# Patient Record
Sex: Female | Born: 1970
Health system: Southern US, Community
[De-identification: ages and names within clinical notes are randomized; demographics above are authoritative.]

## PROBLEM LIST (undated history)

## (undated) DIAGNOSIS — I10 Essential (primary) hypertension: Secondary | ICD-10-CM

## (undated) DIAGNOSIS — K219 Gastro-esophageal reflux disease without esophagitis: Secondary | ICD-10-CM

## (undated) HISTORY — PX: TONSILLECTOMY: SUR1361

## (undated) HISTORY — PX: HAND SURGERY: SHX662

## (undated) HISTORY — PX: ABDOMINAL HYSTERECTOMY: SHX81

## (undated) HISTORY — PX: CHOLECYSTECTOMY: SHX55

---

## 2004-05-13 ENCOUNTER — Ambulatory Visit: Payer: Self-pay | Admitting: Obstetrics and Gynecology

## 2004-06-17 ENCOUNTER — Inpatient Hospital Stay: Payer: Self-pay | Admitting: Obstetrics and Gynecology

## 2005-02-07 ENCOUNTER — Emergency Department: Payer: Self-pay | Admitting: Emergency Medicine

## 2005-11-29 ENCOUNTER — Emergency Department: Payer: Self-pay | Admitting: Emergency Medicine

## 2006-01-12 ENCOUNTER — Other Ambulatory Visit: Payer: Self-pay

## 2006-01-12 ENCOUNTER — Emergency Department: Payer: Self-pay | Admitting: Emergency Medicine

## 2006-08-16 ENCOUNTER — Emergency Department: Payer: Self-pay | Admitting: Emergency Medicine

## 2007-02-16 ENCOUNTER — Ambulatory Visit: Payer: Self-pay | Admitting: Internal Medicine

## 2007-04-26 ENCOUNTER — Ambulatory Visit: Payer: Self-pay | Admitting: Internal Medicine

## 2008-02-14 ENCOUNTER — Emergency Department: Payer: Self-pay | Admitting: Emergency Medicine

## 2008-11-13 ENCOUNTER — Ambulatory Visit: Payer: Self-pay | Admitting: Internal Medicine

## 2008-11-16 ENCOUNTER — Ambulatory Visit: Payer: Self-pay | Admitting: Internal Medicine

## 2008-11-20 ENCOUNTER — Ambulatory Visit: Payer: Self-pay | Admitting: Family Medicine

## 2008-12-11 ENCOUNTER — Emergency Department: Payer: Self-pay | Admitting: Internal Medicine

## 2009-02-10 ENCOUNTER — Other Ambulatory Visit: Payer: Self-pay | Admitting: Obstetrics and Gynecology

## 2009-05-11 ENCOUNTER — Emergency Department: Payer: Self-pay | Admitting: Emergency Medicine

## 2010-09-06 ENCOUNTER — Inpatient Hospital Stay: Payer: Self-pay | Admitting: Specialist

## 2011-02-16 ENCOUNTER — Ambulatory Visit: Payer: Self-pay | Admitting: Sports Medicine

## 2011-02-24 ENCOUNTER — Ambulatory Visit: Payer: Self-pay

## 2011-02-24 ENCOUNTER — Emergency Department: Payer: Self-pay | Admitting: Internal Medicine

## 2011-05-24 ENCOUNTER — Ambulatory Visit: Payer: Self-pay

## 2011-06-01 ENCOUNTER — Ambulatory Visit: Payer: Self-pay | Admitting: Internal Medicine

## 2011-06-14 ENCOUNTER — Ambulatory Visit: Payer: Self-pay

## 2011-06-21 ENCOUNTER — Ambulatory Visit: Payer: Self-pay | Admitting: Internal Medicine

## 2011-08-23 ENCOUNTER — Ambulatory Visit: Payer: Self-pay | Admitting: Podiatry

## 2012-02-21 ENCOUNTER — Inpatient Hospital Stay: Payer: Self-pay | Admitting: Psychiatry

## 2012-02-21 LAB — DRUG SCREEN, URINE
Amphetamines, Ur Screen: NEGATIVE (ref ?–1000)
Benzodiazepine, Ur Scrn: NEGATIVE (ref ?–200)
Cannabinoid 50 Ng, Ur ~~LOC~~: NEGATIVE (ref ?–50)
MDMA (Ecstasy)Ur Screen: NEGATIVE (ref ?–500)
Methadone, Ur Screen: NEGATIVE (ref ?–300)

## 2012-02-21 LAB — URINALYSIS, COMPLETE
Bacteria: NONE SEEN
Bilirubin,UR: NEGATIVE
Glucose,UR: NEGATIVE mg/dL (ref 0–75)
Ketone: NEGATIVE
Nitrite: NEGATIVE
RBC,UR: 1 /HPF (ref 0–5)
Squamous Epithelial: 7
WBC UR: 5 /HPF (ref 0–5)

## 2012-02-21 LAB — COMPREHENSIVE METABOLIC PANEL
Albumin: 4.4 g/dL (ref 3.4–5.0)
Alkaline Phosphatase: 94 U/L (ref 50–136)
Calcium, Total: 9.2 mg/dL (ref 8.5–10.1)
Co2: 23 mmol/L (ref 21–32)
Creatinine: 0.74 mg/dL (ref 0.60–1.30)
EGFR (Non-African Amer.): >60
Glucose: 106 mg/dL — ABNORMAL HIGH (ref 65–99)
SGOT(AST): 197 U/L — ABNORMAL HIGH (ref 15–37)
SGPT (ALT): 102 U/L — ABNORMAL HIGH (ref 12–78)

## 2012-02-21 LAB — CBC
HCT: 43.4 % (ref 35.0–47.0)
HGB: 14.8 g/dL (ref 12.0–16.0)
MCHC: 34.1 g/dL (ref 32.0–36.0)
Platelet: 265 10*3/uL (ref 150–440)
RBC: 4.71 10*6/uL (ref 3.80–5.20)

## 2012-02-21 LAB — TSH: Thyroid Stimulating Horm: 1.53 u[IU]/mL

## 2012-02-21 LAB — ETHANOL
Ethanol %: 0.099 % — ABNORMAL HIGH (ref 0.000–0.080)
Ethanol: 99 mg/dL

## 2012-02-21 LAB — ACETAMINOPHEN LEVEL: Acetaminophen: <2 ug/mL

## 2012-09-26 ENCOUNTER — Emergency Department: Payer: Self-pay | Admitting: Emergency Medicine

## 2012-09-26 LAB — BASIC METABOLIC PANEL
Anion Gap: 6 — ABNORMAL LOW (ref 7–16)
BUN: 21 mg/dL — ABNORMAL HIGH (ref 7–18)
Calcium, Total: 8.5 mg/dL (ref 8.5–10.1)
Chloride: 109 mmol/L — ABNORMAL HIGH (ref 98–107)
Co2: 26 mmol/L (ref 21–32)
Creatinine: 0.96 mg/dL (ref 0.60–1.30)
EGFR (African American): >60
Glucose: 82 mg/dL (ref 65–99)
Sodium: 141 mmol/L (ref 136–145)

## 2012-09-26 LAB — CBC
HGB: 12.9 g/dL (ref 12.0–16.0)
MCH: 31.9 pg (ref 26.0–34.0)
MCHC: 34.8 g/dL (ref 32.0–36.0)
MCV: 92 fL (ref 80–100)
Platelet: 285 10*3/uL (ref 150–440)
RBC: 4.04 10*6/uL (ref 3.80–5.20)

## 2012-10-04 LAB — CBC
HCT: 43.1 % (ref 35.0–47.0)
HGB: 14.6 g/dL (ref 12.0–16.0)
MCV: 92 fL (ref 80–100)
RDW: 12.4 % (ref 11.5–14.5)
WBC: 10.5 10*3/uL (ref 3.6–11.0)

## 2012-10-04 LAB — COMPREHENSIVE METABOLIC PANEL
Alkaline Phosphatase: 93 U/L (ref 50–136)
Anion Gap: 7 (ref 7–16)
BUN: 15 mg/dL (ref 7–18)
Bilirubin,Total: 0.5 mg/dL (ref 0.2–1.0)
Calcium, Total: 8.9 mg/dL (ref 8.5–10.1)
Co2: 27 mmol/L (ref 21–32)
EGFR (Non-African Amer.): >60
Glucose: 94 mg/dL (ref 65–99)
SGOT(AST): 45 U/L — ABNORMAL HIGH (ref 15–37)
SGPT (ALT): 85 U/L — ABNORMAL HIGH (ref 12–78)
Total Protein: 8.4 g/dL — ABNORMAL HIGH (ref 6.4–8.2)

## 2012-10-04 LAB — SEDIMENTATION RATE: Erythrocyte Sed Rate: 1 mm/hr (ref 0–20)

## 2012-10-05 LAB — DRUG SCREEN, URINE
Amphetamines, Ur Screen: NEGATIVE (ref ?–1000)
Cannabinoid 50 Ng, Ur ~~LOC~~: NEGATIVE (ref ?–50)
MDMA (Ecstasy)Ur Screen: NEGATIVE (ref ?–500)
Methadone, Ur Screen: NEGATIVE (ref ?–300)
Opiate, Ur Screen: POSITIVE (ref ?–300)
Phencyclidine (PCP) Ur S: NEGATIVE (ref ?–25)
Tricyclic, Ur Screen: NEGATIVE (ref ?–1000)

## 2012-10-06 ENCOUNTER — Inpatient Hospital Stay: Payer: Self-pay | Admitting: Family Medicine

## 2012-10-06 LAB — PROTEIN ELECTROPHORESIS(ARMC)

## 2012-10-07 LAB — PROTIME-INR: Prothrombin Time: 12.3 secs (ref 11.5–14.7)

## 2012-10-11 ENCOUNTER — Ambulatory Visit: Payer: Self-pay | Admitting: Family Medicine

## 2012-10-11 LAB — CSF CELL COUNT WITH DIFFERENTIAL
CSF Tube #: 1
CSF Tube #: 4
Eosinophil: 0 %
Neutrophils: 0 %
Neutrophils: 0 %
Other Cells: 0 %
WBC (CSF): 7 /mm3

## 2012-10-11 LAB — PROTEIN, CSF: Protein, CSF: 44 mg/dL (ref 15–45)

## 2012-10-11 LAB — GLUCOSE, CSF: Glucose, CSF: 60 mg/dL (ref 40–75)

## 2012-10-14 LAB — CSF CULTURE

## 2012-10-15 ENCOUNTER — Ambulatory Visit: Payer: Self-pay | Admitting: Internal Medicine

## 2012-11-04 ENCOUNTER — Emergency Department: Payer: Self-pay | Admitting: Emergency Medicine

## 2012-11-04 LAB — CBC WITH DIFFERENTIAL/PLATELET
Basophil %: 0.5 %
Eosinophil %: 1.5 %
HGB: 13.5 g/dL (ref 12.0–16.0)
Lymphocyte #: 2.7 10*3/uL (ref 1.0–3.6)
MCHC: 34.5 g/dL (ref 32.0–36.0)
Monocyte #: 1.1 x10 3/mm — ABNORMAL HIGH (ref 0.2–0.9)
Monocyte %: 10.7 %
Neutrophil #: 6.6 10*3/uL — ABNORMAL HIGH (ref 1.4–6.5)
Neutrophil %: 62 %
RBC: 4.45 10*6/uL (ref 3.80–5.20)
WBC: 10.6 10*3/uL (ref 3.6–11.0)

## 2012-11-04 LAB — COMPREHENSIVE METABOLIC PANEL
Alkaline Phosphatase: 87 U/L (ref 50–136)
Anion Gap: 8 (ref 7–16)
BUN: 9 mg/dL (ref 7–18)
Bilirubin,Total: 1.4 mg/dL — ABNORMAL HIGH (ref 0.2–1.0)
Calcium, Total: 9.2 mg/dL (ref 8.5–10.1)
Chloride: 108 mmol/L — ABNORMAL HIGH (ref 98–107)
Co2: 24 mmol/L (ref 21–32)
Creatinine: 0.85 mg/dL (ref 0.60–1.30)
Osmolality: 278 (ref 275–301)
Potassium: 3.2 mmol/L — ABNORMAL LOW (ref 3.5–5.1)
SGPT (ALT): 29 U/L (ref 12–78)
Sodium: 140 mmol/L (ref 136–145)

## 2013-05-27 ENCOUNTER — Ambulatory Visit: Payer: Self-pay | Admitting: Family Medicine

## 2013-08-17 IMAGING — CR DG CHEST 2V
1 series · 2 of 2 positions shown · non-contrast
Comparison: none

REASON FOR EXAM: cough x 10 days.
COMMENTS:   May transport without cardiac monitor

PROCEDURE:     DXR - DXR CHEST PA (OR AP) AND LATERAL  - November 04, 2012 [DATE]
RESULT:     Comparison: 06/24/2011

[Series 1: w chest pa · 0.14mm/px · 2 of 2 slices shown]
[im 1/2]
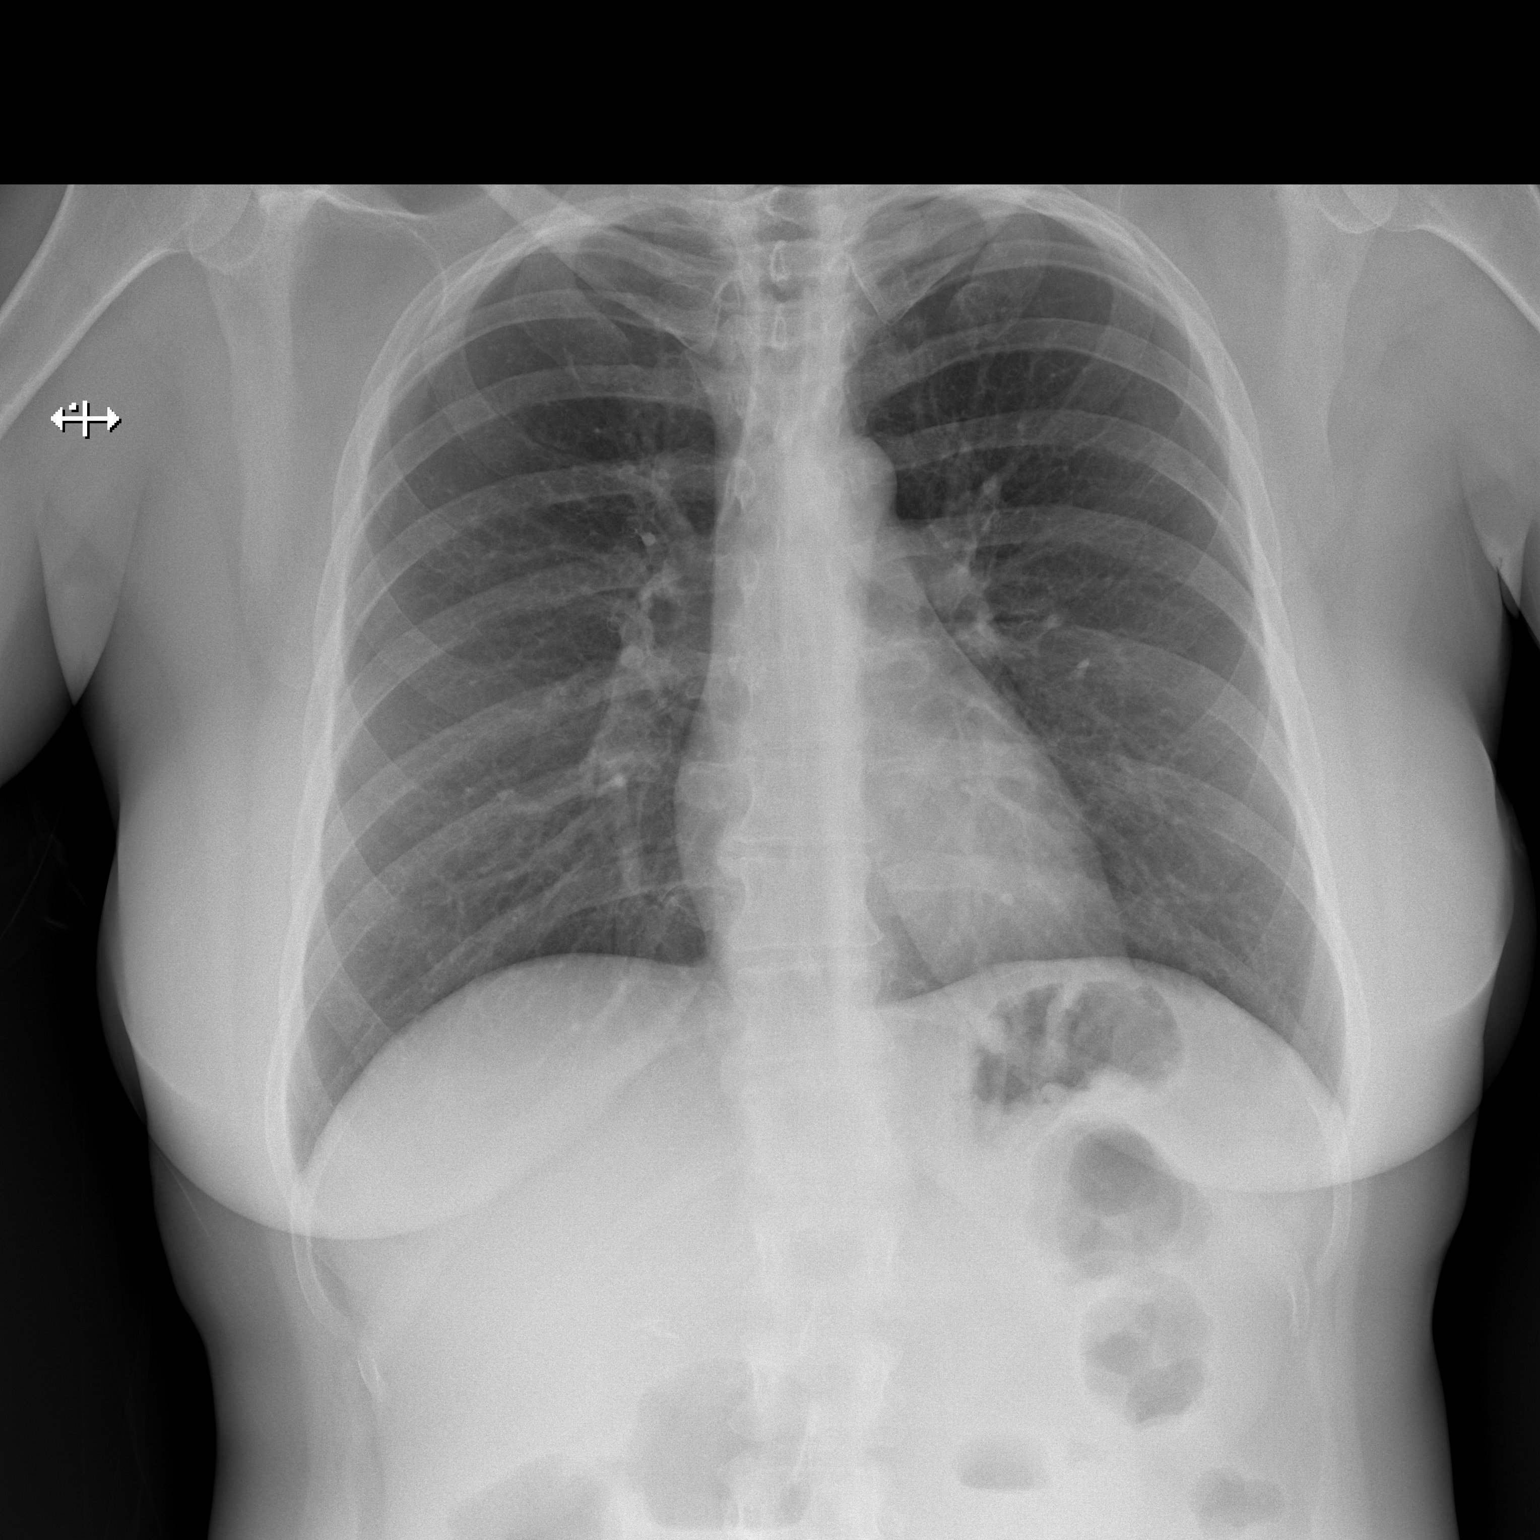
[im 2/2]
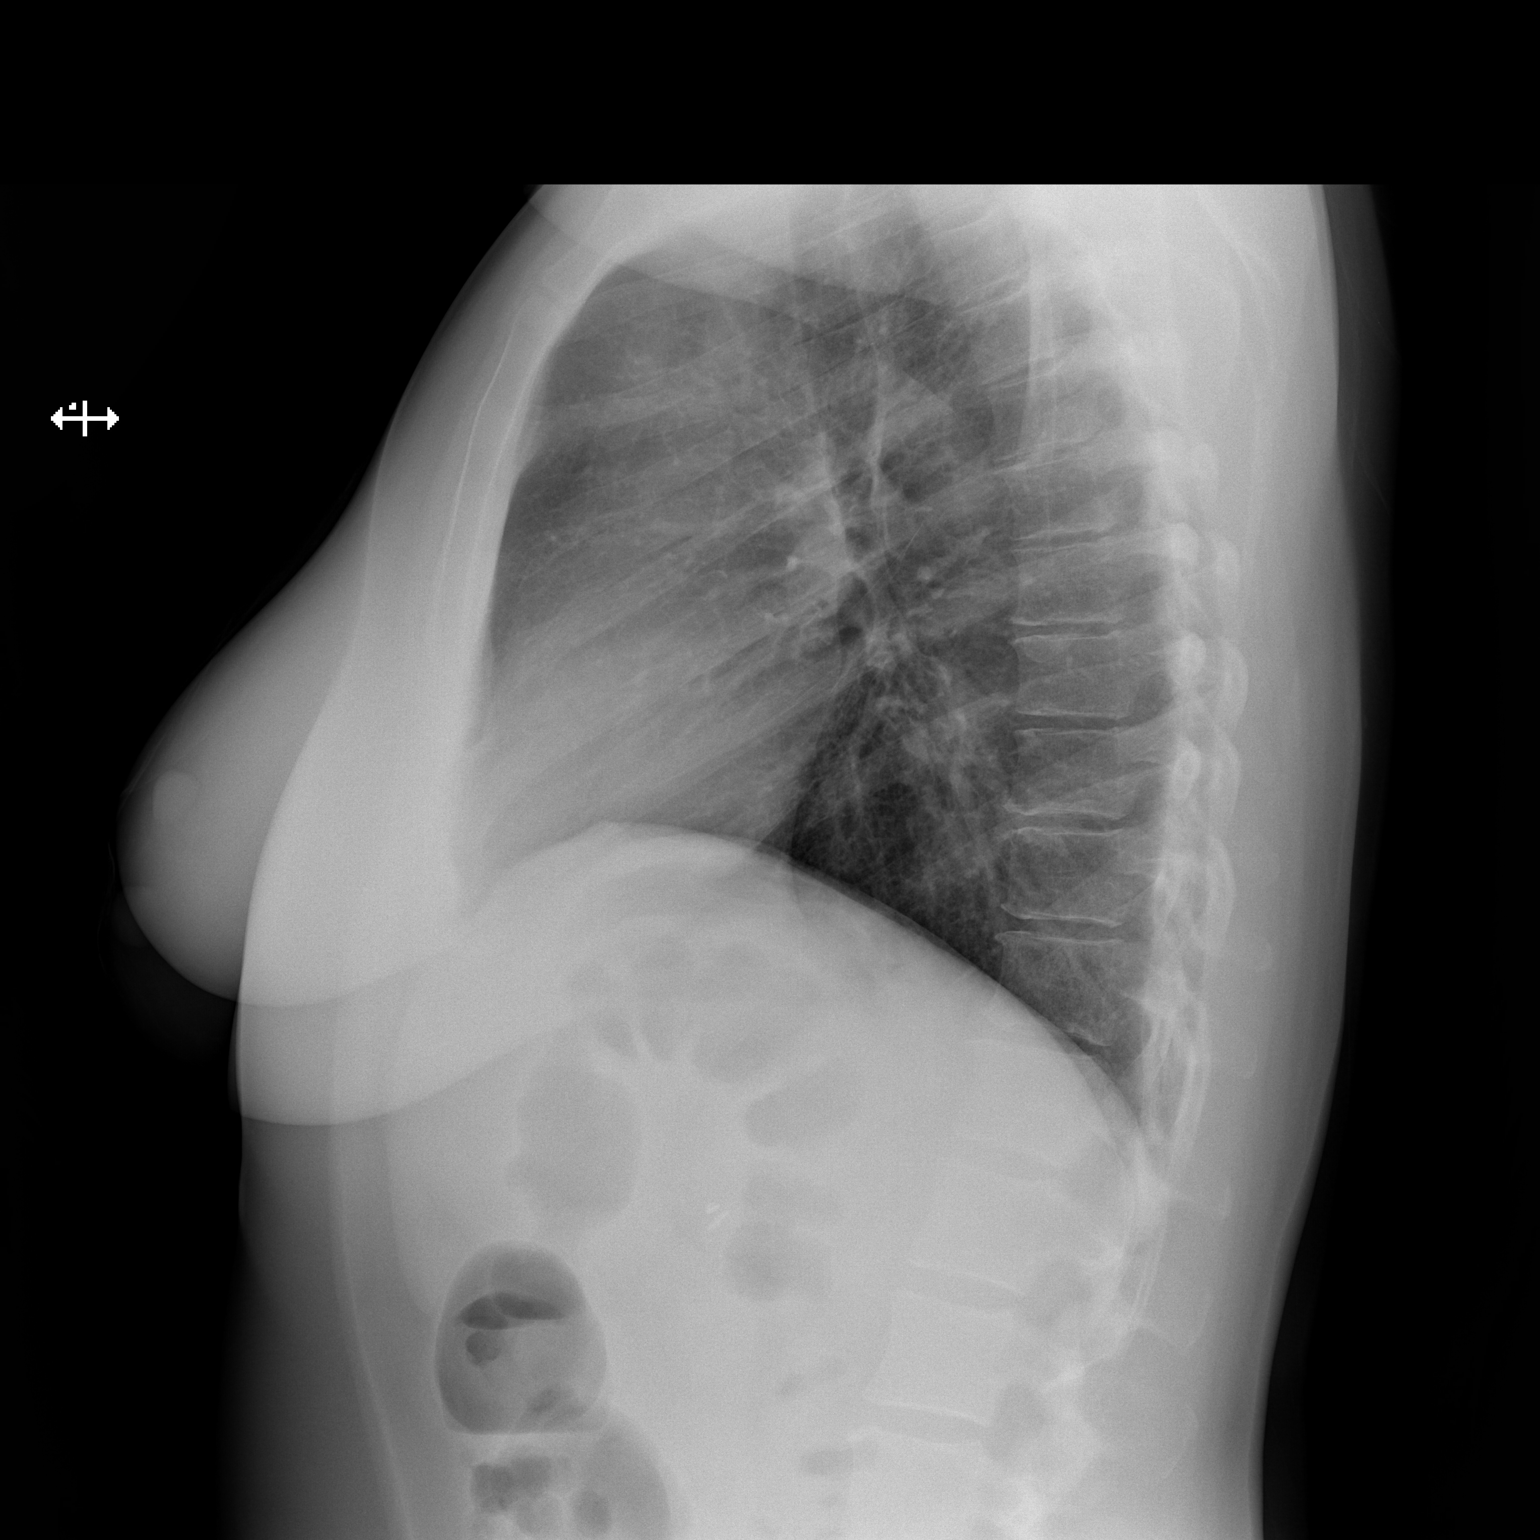

[2 of 2 positions shown; findings below may reference images not displayed]

FINDINGS: The heart and mediastinum are stable. No focal pulmonary opacities.
IMPRESSION: No acute cardiopulmonary disease.

[REDACTED]

## 2013-12-18 ENCOUNTER — Ambulatory Visit: Payer: Self-pay | Admitting: Family Medicine

## 2013-12-26 ENCOUNTER — Ambulatory Visit: Payer: Self-pay | Admitting: Internal Medicine

## 2014-02-17 ENCOUNTER — Ambulatory Visit: Payer: Self-pay | Admitting: Family Medicine

## 2014-02-17 ENCOUNTER — Ambulatory Visit: Payer: Self-pay | Admitting: Emergency Medicine

## 2014-02-17 LAB — CBC WITH DIFFERENTIAL/PLATELET
Basophil #: 0 10*3/uL (ref 0.0–0.1)
Basophil %: 0.4 %
Eosinophil #: 0.2 10*3/uL (ref 0.0–0.7)
Eosinophil %: 2.3 %
HCT: 39.6 % (ref 35.0–47.0)
HGB: 13 g/dL (ref 12.0–16.0)
Lymphocyte #: 3.6 10*3/uL (ref 1.0–3.6)
Lymphocyte %: 34.2 %
MCH: 30.5 pg (ref 26.0–34.0)
MCHC: 32.8 g/dL (ref 32.0–36.0)
MCV: 93 fL (ref 80–100)
Monocyte #: 0.6 x10 3/mm (ref 0.2–0.9)
Monocyte %: 5.5 %
Neutrophil #: 6 10*3/uL (ref 1.4–6.5)
Neutrophil %: 57.6 %
Platelet: 285 10*3/uL (ref 150–440)
RBC: 4.26 10*6/uL (ref 3.80–5.20)
RDW: 12 % (ref 11.5–14.5)
WBC: 10.4 10*3/uL (ref 3.6–11.0)

## 2014-02-17 LAB — COMPREHENSIVE METABOLIC PANEL
Albumin: 4.2 g/dL (ref 3.4–5.0)
Alkaline Phosphatase: 71 U/L
Anion Gap: 11 (ref 7–16)
BUN: 11 mg/dL (ref 7–18)
Bilirubin,Total: 1.2 mg/dL — ABNORMAL HIGH (ref 0.2–1.0)
Calcium, Total: 9 mg/dL (ref 8.5–10.1)
Chloride: 100 mmol/L (ref 98–107)
Co2: 25 mmol/L (ref 21–32)
Creatinine: 0.99 mg/dL (ref 0.60–1.30)
EGFR (African American): >60
EGFR (Non-African Amer.): >60
Glucose: 86 mg/dL (ref 65–99)
Osmolality: 271 (ref 275–301)
Potassium: 3.3 mmol/L — ABNORMAL LOW (ref 3.5–5.1)
SGOT(AST): 17 U/L (ref 15–37)
SGPT (ALT): 27 U/L
Sodium: 136 mmol/L (ref 136–145)
Total Protein: 7.7 g/dL (ref 6.4–8.2)

## 2014-02-17 LAB — URINALYSIS, COMPLETE
Blood: NEGATIVE
Glucose,UR: NEGATIVE
Ketone: NEGATIVE
Leukocyte Esterase: NEGATIVE
Nitrite: NEGATIVE
Ph: 6 (ref 5.0–8.0)
Specific Gravity: >=1.03 (ref 1.000–1.030)

## 2014-02-17 LAB — LIPASE, BLOOD: Lipase: 149 U/L (ref 73–393)

## 2014-02-17 LAB — AMYLASE: Amylase: 55 U/L (ref 25–115)

## 2014-02-18 ENCOUNTER — Ambulatory Visit: Payer: Self-pay | Admitting: Emergency Medicine

## 2014-02-19 LAB — URINE CULTURE

## 2014-03-04 ENCOUNTER — Ambulatory Visit: Payer: Self-pay | Admitting: Physician Assistant

## 2014-03-09 ENCOUNTER — Ambulatory Visit: Payer: Self-pay | Admitting: Physician Assistant

## 2014-04-17 ENCOUNTER — Emergency Department: Payer: Self-pay | Admitting: Emergency Medicine

## 2014-04-26 ENCOUNTER — Ambulatory Visit: Payer: Self-pay | Admitting: Family Medicine

## 2014-07-20 ENCOUNTER — Emergency Department: Payer: Self-pay | Admitting: Student

## 2014-07-26 ENCOUNTER — Ambulatory Visit: Payer: Self-pay | Admitting: Surgery

## 2014-07-31 DIAGNOSIS — S63055A Dislocation of other carpometacarpal joint of left hand, initial encounter: Secondary | ICD-10-CM | POA: Insufficient documentation

## 2014-08-24 DIAGNOSIS — T8460XA Infection and inflammatory reaction due to internal fixation device of unspecified site, initial encounter: Secondary | ICD-10-CM | POA: Insufficient documentation

## 2014-10-11 ENCOUNTER — Emergency Department: Admit: 2014-10-11 | Disposition: A | Discharge status: Home / Self Care | Payer: Self-pay | Admitting: Internal Medicine

## 2014-10-11 LAB — TROPONIN I: Troponin-I: <0.03 ng/mL

## 2014-10-11 LAB — COMPREHENSIVE METABOLIC PANEL
ANION GAP: 11 (ref 7–16)
Albumin: 4.6 g/dL
Alkaline Phosphatase: 101 U/L
BILIRUBIN TOTAL: 1.1 mg/dL
BUN: 15 mg/dL
CO2: 28 mmol/L
Calcium, Total: 9.4 mg/dL
Chloride: 98 mmol/L — ABNORMAL LOW
Creatinine: 0.72 mg/dL
EGFR (Non-African Amer.): >60
Glucose: 92 mg/dL
Potassium: 3.2 mmol/L — ABNORMAL LOW
SGOT(AST): 58 U/L — ABNORMAL HIGH
SGPT (ALT): 92 U/L — ABNORMAL HIGH
Sodium: 137 mmol/L
TOTAL PROTEIN: 7.9 g/dL

## 2014-10-11 LAB — URINALYSIS, COMPLETE
BLOOD: NEGATIVE
Bilirubin,UR: NEGATIVE
Glucose,UR: NEGATIVE mg/dL (ref 0–75)
Ketone: NEGATIVE
Leukocyte Esterase: NEGATIVE
Nitrite: NEGATIVE
PH: 6 (ref 4.5–8.0)
Protein: NEGATIVE
RBC,UR: <1 /HPF (ref 0–5)
Specific Gravity: 1.011 (ref 1.003–1.030)
Squamous Epithelial: 28
WBC UR: 1 /HPF (ref 0–5)

## 2014-10-11 LAB — CBC
HCT: 40.9 % (ref 35.0–47.0)
HGB: 13.7 g/dL (ref 12.0–16.0)
MCH: 29.8 pg (ref 26.0–34.0)
MCHC: 33.6 g/dL (ref 32.0–36.0)
MCV: 89 fL (ref 80–100)
Platelet: 303 10*3/uL (ref 150–440)
RBC: 4.62 10*6/uL (ref 3.80–5.20)
RDW: 12.4 % (ref 11.5–14.5)
WBC: 10.3 10*3/uL (ref 3.6–11.0)

## 2014-10-11 LAB — TSH: THYROID STIMULATING HORM: 1.136 u[IU]/mL

## 2014-10-24 NOTE — Discharge Summary (Signed)
PATIENT NAME:  Cynthia Kramer, Cynthia Kramer MR#:  [id number] DATE OF BIRTH:  1971/02/20  DATE OF ADMISSION:  02/21/2012 DATE OF DISCHARGE:  02/24/2012  HOSPITAL COURSE: See dictated History and Physical for details of admission. This 44 year old woman was brought to the Emergency Room after making some calls to her family suggesting possible suicidality and being picked up by the Police. Initially she was sad, tearful, and resistant to coming into the hospital. She has calmed down and for the last couple of days has been calm and cooperative. Her affect is upbeat. She is participating in groups and shows improved insight. She has been counseled about the importance of staying off of alcohol and intoxicating drugs because of their effect on behavior and mood. She has not wanted to start any psychiatric medicine, and there is not a clear indication for insisting on starting antidepressants. She has been educated that she needs to get involved in counseling to help with stress management. At this point, she denied suicidal ideation and is not showing any psychotic or suicidal behavior. She has been treated with medicine for her blood pressure. She has p.r.n. trazodone if needed to assist with sleep. The patient will be discharged with [facility].   LABORATORY, DIAGNOSTIC AND RADIOLOGICAL DATA: Admission labs showed 1+ blood in the urine, possible white blood cells. Drug screen positive for opiates. TSH normal. CBC normal. Alcohol level was 99.0. ALT 102, AST 197, total protein 8.4.   DISCHARGE MEDICATIONS:  1. Hydrochlorothiazide 25 mg per day.  2. Trazodone 100 mg at night p.r.n. for sleep.  3. Amoxicillin 875 mg twice a day for her tooth infection.  4. Zyrtec 10 mg per day.   MENTAL STATUS EXAM AT DISCHARGE: Neatly dressed and groomed woman. Cooperative. Good eye contact. Normal psychomotor activity. Affect smiling, upbeat and reactive. Mood stated as good. Thoughts appear lucid with no evidence  of loosening of associations or delusions. Denies delusions or hallucinations. Denies suicidal or homicidal ideation. Shows reasonably good judgment and insight. Intelligence normal, short and long-term memory intact.   DISPOSITION: Discharge home. [facility].  DIAGNOSIS, PRINCIPAL AND PRIMARY:  AXIS I: Depression, not otherwise specified.   SECONDARY DIAGNOSES:  AXIS I:  1. Alcohol abuse.  2. Opiate abuse.   AXIS II: Deferred.   AXIS III:  1. High blood pressure. 2. Tooth infection.   AXIS IV: Moderate-to-severe from recent stress from a friend dying and stress in her home life.   AXIS V: Functioning at time of discharge 60.   ____________________________ Audery AmelJohn T. Clapacs, MD jtc:cbb D: 02/24/2012 12:41:18 ET T: 02/24/2012 17:09:41 ET JOB#: [id number]  cc: Audery AmelJohn T. Clapacs, MD, <Dictator> Audery AmelJOHN T CLAPACS MD ELECTRONICALLY SIGNED 02/24/2012 21:33

## 2014-10-24 NOTE — Consult Note (Signed)
Brief Consult Note: Comments: Psychiatry: Patient needs admission for stabilization and prob. detox. H&P will be done. Orders done.  Electronic Signatures: Audery Amellapacs, Nafeesa Dils T (MD)  (Signed 17-Aug-13 15:06)  Authored: Brief Consult Note   Last Updated: 17-Aug-13 15:06 by Audery Amellapacs, Latonyia Lopata T (MD)

## 2014-10-24 NOTE — H&P (Signed)
PATIENT NAME:  Cynthia Kramer, Cynthia Kramer MR#:  [id number] DATE OF BIRTH:  1971-03-17  DATE OF ADMISSION:  02/21/2012  IDENTIFYING INFORMATION: The patient is a 44 year old woman admitted under involuntary petition to the Emergency Room.   CHIEF COMPLAINT: "I just want to go home."   HISTORY OF PRESENT ILLNESS: Information was obtained from the patient and from the commitment petition and the chart. The patient was brought in under commitment petition filed by the police, who had been called by her family out of concern for her. When they found her she a loaded hand gun on her and was wandering out in the woods in the dark. The patient states that she has been under a lot of stress. Major stresses are that an elderly gentleman she had developed a close relationship with passed away a couple of days ago. Also, she has been fighting with her boyfriend which she blames on his substance abuse problem. Also, she has been having financial strain because she is out of work since losing her job a couple of months ago. She has been feeling more nervous and down, feeling kind of overwhelmed. Apparently she made a phone call to her daughter in which she made some statements that were interpreted as possibly indicating suicidal ideation. The patient now denies that that was what she was intending to say. She also apparently left a note for her boyfriend that could possibly be interpreted as suicidal, although again she is denying that that is what she meant by it. She admits that she had been drinking alcohol at the time that this happened, had had several glasses of wine over the course of the day, although she minimizes the degree to which that was a contributing problem. She also has been probably drinking a little bit more recently most days for the last couple of weeks, probably 1 to 2 drinks most days, although again she minimizes the degree to which it is a problem. She is not currently getting any psychiatric treatment and  says she is not on any psychiatric medication.   PAST PSYCHIATRIC HISTORY: She has never been in a psychiatric hospital. She says that she has never seen a therapist, counselor or psychiatrist, although she does report that about 10 years ago around the time of her divorce she was prescribed Prozac for a while. She said that it made her feel lazy and so she stopped taking it. She denies ever having been diagnosed with any mental health problems. No history of suicide attempts. She denies any history of violence.   SUBSTANCE ABUSE HISTORY: The patient denies that she has ever had any kind of substance abuse problem. She denies that she uses any drugs. She does say that she has been drinking a little bit more recently but  plays down the effects.   PAST MEDICAL HISTORY: The patient has high blood pressure for which she takes hydrochlorothiazide. She also has some gastric reflux symptoms for which she takes Nexium. She denies any other ongoing medical problems.   SOCIAL HISTORY: She is divorced. Recently she has been living with her boyfriend. She says that he has a substance abuse problem. She has three children, a 45 year old son who lives with his father, and 45 year old daughters, two of them, one of whom called the police. The patient used to work at [employer] but lost her job a few months ago after missing work on a couple of occasions. She does not have any insurance currently now. She feels under a  lot of financial stress.   FAMILY HISTORY: No known family history of mental illness.   CURRENT MEDICATIONS:  1. Hydrochlorothiazide 25 mg per day.  2. Nexium 40 mg per day.  3. Zyrtec 10 mg per day.   ALLERGIES: No known drug allergies.   REVIEW OF SYSTEMS: She is feeling tearful, sad, says that she is feeling embarrassed. She denies any acute pain. She denies any nausea. She denies any suicidal or homicidal ideation. She denies hallucinations.   MENTAL STATUS EXAM: The patient is a somewhat  disheveled woman, looks her stated age. Cooperative with the interview. Eye contact is poor. Psychomotor activity decreased. She stays curled up a bit, rocks some especially when she gets upset. Speech is normal in rate, tone, and volume. Affect is tearful distressed. Mood is stated as being better. Thoughts are generally lucid. No evidence of psychotic thinking or loosening of associations. She denies auditory or visual hallucinations. She denies any suicidal or homicidal ideation. Intelligence appears to be average. Long-term memory is intact. Memory for the events that happened last night is a little bit sketchy. Current short-term memory is grossly intact. Recent judgment is impaired. Current judgment is still impaired, insight impaired.    PHYSICAL EXAMINATION:  GENERAL: The patient looks upset but does not appear to be in any clear acute physical distress.   VITAL SIGNS: She has a most recent pulse of 98, respirations 18, blood pressure 145/71, temperature 97.5.   HEENT: Pupils are equal and reactive. Face is symmetric. Head is symmetric. Oral mucosa is normal.   NECK AND BACK: Nontender.   SKIN: No acute skin lesions identified.   NEUROLOGICAL: Cranial nerves are all intact and symmetric, strength and reflexes normal throughout.   LUNGS: Clear to auscultation.   HEART: Regular rate and rhythm.   ABDOMEN: Soft, nontender, normal bowel sounds. Normal gait.   LABORATORY, DIAGNOSTIC AND RADIOLOGICAL DATA: Urinalysis: Possible positive for urinary tract infection. Drug screen positive for opiates. TSH normal. CBC normal. Alcohol level on admission was 99.0. Chemistry panel showed a glucose just slightly elevated of no significance, but her ALT is elevated at 102, AST elevated at 197, total protein elevated at 8.4.   ASSESSMENT: This is a 44 year old woman brought in under involuntary commitment having made statements that were interpreted as being potentially suicidal. She is still upset,  tearful, a little bit agitated, not psychotic. She had opiates and alcohol in her system when she came in. She has been under a lot of stress recently. She is denying that she was suicidal or has any suicidal ideation, but the whole story is worrisome enough that I think it is best to admit her to the hospital.   TREATMENT PLAN: Admit the patient to Psychiatry. Medications for anxiety and sleep p.r.n.  will be provided. She will be oriented to the unit and included in groups and activities. We will try and get collateral history, if possible. We will work with her on stress management. Continue to evaluate whether she needs any psychiatric medicine.  DIAGNOSIS, PRINCIPAL AND PRIMARY:  AXIS I: Depression, not otherwise specified.   SECONDARY DIAGNOSES:  AXIS I:  1. Rule out alcohol abuse. 2. Rule out opiate abuse.   AXIS II: Deferred.   AXIS III:  1. High blood pressure. 2. Gastric reflux symptoms. 3. Seasonal allergies.   AXIS IV: Severe stress by her report from recent death of a friend and fighting with her boyfriend.   AXIS V: Functioning at time of evaluation 30.  ____________________________ Audery Amel, MD jtc:cbb D: 02/21/2012 16:44:05 ET T: 02/22/2012 08:22:13 ET JOB#: [id number]  cc: Audery Amel, MD, <Dictator> Audery Amel MD ELECTRONICALLY SIGNED 02/22/2012 16:48

## 2014-10-27 NOTE — Discharge Summary (Signed)
PATIENT NAME:  Cynthia Kramer, BAGENT MR#:  [id number] DATE OF BIRTH:  19-Dec-1970  DATE OF ADMISSION:  10/06/2012 DATE OF DISCHARGE:  10/07/2012  REASON FOR ADMISSION: Multiple complaints including headache, numbness and tingling of upper and lower extremities, blurry vision.   DISCHARGE DIAGNOSES:   1.  Headache. 2.  Numbness and tingling of upper and lower extremities 3.  Blurry vision.  Unknown reason for numbness and tingling of upper or lower extremities, double vision and headache with normal MRI.  The patient is undergoing work-up for multiple sclerosis as a possible diagnosis, but not confirmed.  PRIMARY CARE PHYSICIAN: At [facility].   FOLLOW UP: Follow up with Dr. Yehuda Savannah Burn and Dr. Gurney Maxin in the next 7 to 14 days.   PROCEDURES PLANNED:  The patient needs a LP, which has been arranged to be done this following Tuesday in the radiology department and it is going to be for ruling out the possibility of multiple sclerosis.   MEDICATIONS AT DISCHARGE: Hydrochlorothiazide 25 mg daily, Zyrtec 10 mg daily, Flonase 50 mcg once a day, Nexium 30 mg once a day, Norco one every 4 hours p.r.n. pain and headache, tramadol 50 mg every 4 hours needed for pain, alprazolam 0.5 mg every 6 hours.   IMPORTANT RESULTS:  Ultrasound of carotid arteries showed no significant or hemodynamically significant carotid stenosis. There is less than 50% stenosis on the right side and the left side good, both with good anterograde flow. MRI of the brain unremarkable, pre and postcontrast brain MRI. There was no MR evidence of demyelinating or dysmyelinating disorder. Vascular flow was unremarkable.  The globes were  homogeneous without signal or enhancement abnormalities. Evaluation of the extra ocular muscles demonstrated no signal or enhancement abnormalities and the muscles appear symmetric.  The visualized portions of the optic nerve and chiasm show no signal or enhancement abnormalities.  Cerebellum,  pons and midbrain were overall normal. Cervical spine x-rays show some degenerative disk changes. Given the patient's symptoms, a follow up MRI is recommended although by Dr. Hardie Shackleton evaluation, the patient has good reflexes up and down for which this was not absolutely necessary.  Lower spine no bony abnormalities. There is disk space narrowing at the level of L5-S1.  The first segment appears to be transitional for what if the problem persists, maybe the next follow up would be MR of the neck and lumbar spine. Again by examination of Dr. Gurney Maxin at this moment was not a test that needed to be completed as she did not have any significant findings that correlate with her symptoms. She does, but not findings.  No acute intracranial process on the CT scan.   LABORATORY DATA:  Glucose was normal at 94, creatinine 0.71, sodium was slightly low at 135 (the patient takes hydrochlorothiazide), potassium was 3.6. Overall electrolytes were under normal limits. Her AST and ALT were slightly elevated at 45 and 85. Her UDS showed positive opiates, negative for the rest. White count 10.5, erythrocyte sedimentation rate was 1, platelet count 246, hemoglobin 14.6. PT-INR were normal with a PT and INR of 0.9. Hepatitis profile was negative.  HIV antibodies were negative, nonreactive. Serum protein electrophoresis was overall unremarkable. There was some more tests, ANA that were ordered. They have not been reported and they need to be followed by primary care physician.   The other thing is the results were from the LP need to be sent to Dr. Gurney Maxin or [facility].  HOSPITAL COURSE:  This patient is a very nice 44 year old female who came to the ER with  definite multiple neurologic complaints to include upper extremity numbness and tingling, headache and blurry vision.  Also she said that she was dizzy and lightheaded and her lower extremities were tingling well.  Apparently few days prior  to this the patient developed abrupt onset of vision loss in the right eye and that happened at night.  The vision loss came back few hours, but since then continued to be blurry. Currently she has some blurry vision on the other side on the right eye. She had some mild double vision that resolved and numbness and tingling in a patchy distribution of the upper extremities and on her face bilaterally it appeared after that. The patient was very anxious.  As mentioned above, MRI did not show any signs of this and the physical exam pretty much unremarkable.   As far as her visual disturbance, optic neuritis was a possibility, but very unlikely based on the MRI findings. There were no sclerotic white matter lesions of the brain for which multiple sclerosis is less likely, although Dr. Melrose Nakayama recommended to continue work-up with completion including a spinal tap checking for oligoclonal bands and IgG in this.    Carotid ultrasound was done.  The patient was recommended to take a baby aspirin for her headaches.  There was no risk for tumor cerebral, no temporal arteritis and her ESR was negative. Her numbness was very unclear due to the distribution of it, but there were no signs of significant neuropathy. Overall, the patient did well, she started feeling better. We were not able to do the LP in-house based on the patient is taking naproxen prior to the LP.   CONDITION ON DISCHARGE:  The patient discharged in good condition with good recommendations to follow up with her primary care physician.  Discharge time 40 min      ____________________________ [place] Sink, MD rsg:ct D: 10/08/2012 07:12:00 ET T: 10/08/2012 07:37:03 ET JOB#: [id number]  cc: Welby Sink, MD, <Dictator> Doneta Public. Melrose Nakayama, MD [place] MD ELECTRONICALLY SIGNED 10/14/2012 22:35

## 2014-10-27 NOTE — Consult Note (Signed)
Referring Physician:  Henreitta Leber   Primary Care Physician:  BURNS, HARRIET [phone]  Reason for Consult: Admit Date: 04-Oct-2012  Chief Complaint: HA, blurry visio.  Reason for Consult: blurry vision   History of Present Illness: History of Present Illness:   HISTORY OF PRESENT ILLNESS:  woman who presented on March 31st, 2014 to the ED with various neurologic complaints including upper extremity numbness and tingling, headaches and blurry vision.  patient says today that a few days ago she developed abrupt onset vision loss in the right eye.  This happened at night.  The vision loss came back somewhat over the next few hours, but has never gotten back to normal.  Currently she complains of some blurry vision in the right eye.  In addition, she says that there is also worse blurriness when looking to the right with either eye.  There is also some mild double vision.  In addition, she has developed a numbness and tingling in a patchy distribution in her upper extremities and in her face bilaterally.  She feels very anxious.  She is concerned that she has multiple sclerosis.  She had an MRI planned for next week but this was moved up since she came to the ER.  No clear cause for her symptoms.  Nothing makes them better or worse.  She feels that the symptoms are severe.   MEDICAL HISTORY:  Depression, hypertension, GERD.  No known surgeries. HISTORY:  Quit smoking cigs a few years ago.  No alcohol abuse. No drug use.   HISTORY:  No significant family history of multiple sclerosis or other autoimmune disease.   MEDICATIONS:  Nexium 40 mg daily, cetirizine 10 mg daily, Norco 5/325 mg 1 to 2 tabs q. 6 hours as needed, hydrochlorothiazide 25 mg daily and Flonase 1 spray to each nostril daily.   ALLERGIES:  No known drug allergies.   EXAM   Anxious.  NAD.  Normocephalic and atraumatic. exam shows normal disc size, appearance and C/D ratio without clear evidence of papilledema. and S2  sounds are within normal limits, without murmurs, gallops, or rubs. - Normal- NormalDrift - Absent bilaterally.- Gait and station are within normal limits.    Shoulder abduction (deltoid/supraspinatus, axillary/suprascapular n, C5)   Elbow flexion (biceps brachii, musculoskeletal n, C5-6)   Elbow extension (triceps, radial n, C7)   Finger adduction (interossei, ulnar n, T1)    Hip flexion (iliopsoas, L1/L2)   Knee flexion (hamstrings, sciatic n, L5/S1)   Knee extension (quadriceps, femoral n, L3/4)   Ankle dorsiflexion (tibialis anterior, deep fibular n, L4/5)   Ankle plantarflexion (gastroc, tibial n, S1) STATUS:is oriented to person, place and time.  Recent and remote memory are intact.  Attention span and concentration are intact.  Naming, repetition, comprehension and expressive speech are within normal limits.  Patient's fund of knowledge is within normal limits for educational level. NERVES:double vision with lateral gaze but sclera are buried bilaterally. -reports blurry vision in the right eye only, visual fields are normal and good acuity. Normal    CN II (normal visual acuity and visual fields)   CN III, IV, VI (extraocular muscles are intact)   CN V (facial sensation is intact bilaterally)   CN VII (facial strength is intact bilaterally)   CN VIII (hearing is intact bilaterally)   CN IX/X (palate elevates midline, normal phonation)   CN XI (shoulder shrug strength is normal and symmetric)   CN XII (tongue protrudes midline)  She endorses  decreased sensation to all modalities throughout her face and in a patchy distribution in both upper extremities.  3+/3+    Biceps 3+/3+    Brachioradialis  3+/3+    Patellar 3+/3+    Achilles Hoffman sign is absent.  Babinski sign is absent.  to nose testing is within normal limits.   MR BRAIN WO/W CONTRAST  - Oct 05 2012  9:24AM viewed and discussed with patient and family, unremarkable.Technique: Multiplanar/multisequence imaging of the brain is pre  and post intravenous administration of 15 ml of IV  Evaluation of the diffusion weighted imaging demonstrates no abnormalities. There is not evidence of abnormal parenchymal or enhancing masses or nodules. No intra-axial or extra-axial collections are identified. The sella and parasellar regions and as well as the cerebellopontine angle regions and visualized  of the seventh and eighth cranial nerves demonstrates no signal enhancement abnormalities. There is no MR evidence of demyelinating or disorders. The vascular flow-voids are unremarkable. The pons and midbrain demonstrate no signal or enhancement The ventricles and cisterns are patent. There is no of paranasal sinus disease within the visualized paranasal Limited evaluation of the orbits demonstrates no gross or extraconal abnormalities or pre or post septal The globes are homogeneous without signal or enhancement Limited evaluation of the extraocular muscles demonstrates signal or enhancement abnormalities. The muscles appear symmetric. The portions of the optic nerve and chiasm demonstrate no signal enhancement abnormalities.  Unremarkable pre an post contrasted brain MRI  AND PLAN:  44 year old woman who presented on March 31st, 2014 to the ED with various neurologic complaints including upper extremity numbness and tingling, headaches and blurry vision. DISTURBANCEsclerosis with optic neuritis is the clear concern.  There is pain with moving the right eye in setting of blurry vision in the right eye.  However, brain MRI is completely normal w and wo contrast.  This makes optic neuritis much less likely.  In addition, there are no sclerotic white matter brain lesions at all which makes multiple sclerosis much less likely.  On exam she has increased reflexes but the reflexes are equal bilaterally and there are no pathologic Hoffman or Babinski responses.  To be certain, would recommend completion of M.S. workup with SPINAL TAP to check for IgG index and  oligoclonal bands.  In setting of normal MRI, this is very unlikely to be positive but not unprecedented.  The finding that is most concerning is persistent pain with moving the right eye which is very specific for optic neuritis.   that the visual disturbance came about rapidly like a shade coming down over the eye, this is concerning for a vascular event.  Would recommend CAROTID ULTRASOUND to evaluate for carotid aftery stenosis.  Patient should take BABY ASPIRIN once a day.  cause of the headaches is unclear, no tumor, demyelination or stroke identified.  Possible tension headache.  Does not have risk factors for pseudotumor.  Denies jaw claudication to suggest temporal arteritis.  MRA BRAIN WITHOUT CONTRAST could be considered to rule out aneuysm if initial workup is negative, no clear pupillary asymmetry to suggest compression of the oculomotor nerve.   cause.  No clear distribution.  Inlcudes bilateral face and upper extremities which is unusual as it includes mutliple tracts in different locations, not explained by one clear lesion.    Melrose Nakayama, MD  ROS:  General denies complaints   HEENT blurry vision   Lungs no complaints   Cardiac no complaints   GI no complaints   GU no complaints  Musculoskeletal no complaints   Extremities no complaints   Skin no complaints   Neuro headache  numbness/tingling   Endocrine no complaints   Psych anxiety   Past Medical/Surgical Hx:  Hx of Hyperthyroidism:   Anxiety:   Hypertension:   Ovarian Cyst:   Endometriosis:   Hysterectomy - Partial \{Endometriosis\}:   Cholecystectomy:   Tonsillectomy:   Home Medications: Medication Instructions Last Modified Date/Time  Norco 5 mg-325 mg oral tablet 1-2 tab(s) PO every 4 hours as needed FOR PAIN  31-Mar-14 18:34  hydrochlorothiazide 25 mg oral tablet 1 tab(s) orally once a day 31-Mar-14 18:34  Zyrtec 10 mg oral tablet 1 tab(s) orally once a day 31-Mar-14 18:34  Flonase 50 mcg/inh nasal  spray 1 spray(s) nasal once a day 31-Mar-14 18:34  Nexium 30 milligram(s) orally once a day 31-Mar-14 18:34   [facility] Current Meds:  Acetaminophen * tablet, ( Tylenol (325 mg) tablet)  650 mg Oral q4h PRN for pain or temp. greater than 100.4  - Indication: Pain/Fever  Ondansetron injection, ( Zofran injection )  4 mg, IV push, q4h PRN for Nausea/Vomiting  Indication: Nausea/ Vomiting  Cetirizine HCl tablet, ( ZyrTEC)  10 mg Oral daily  - Indication: Rhinitis/ Allergy Symptoms  Esomeprazole capsule, ( NexIUM capsule)  40 mg Oral ac/break  - Indication: Erosive esophagitis, GERD, Peptic Ulcer Disease and Prevention  Instructions:  May open capsule and mix in 1 tablespoon of applesauce for immediate administration.  Fluticasone Propionate Nasal Spray, ( Flonase Nasal spray )  2 spray(s) Both Nostrils daily  -Indication:Seasonal Allergy, Rhinitis, Asthma  Shake Well Before Use  Hydrochlorothiazide tablet, ( Esidrix)  25 mg Oral daily  - Indication: Edema/ Hypertension/ Diuresis/ CHF  Acetaminophen-HYDROcodone 325/5 mg tablet,  ( HYDROcodone-Acetaminophen  5/325 mg tablet)  1 to 2 tablet(s) Oral q4h PRN for pain  - Indication: Pain  Instructions:  [Med Admin Window: 30 mins before or after scheduled dose]  Allergies:  No Known Allergies:   Vital Signs: **Vital Signs.:   01-Apr-14 06:20  Vital Signs Type Routine  Temperature Temperature (F) 97.9  Celsius 36.6  Temperature Source oral  Pulse Pulse 79  Respirations Respirations 19  Systolic BP Systolic BP 357  Diastolic BP (mmHg) Diastolic BP (mmHg) 83  Mean BP 95  Pulse Ox % Pulse Ox % 99  Pulse Ox Activity Level  At rest  Oxygen Delivery Room Air/ 21 %    09:28  Vital Signs Type Routine  Temperature Temperature (F) 97.7  Celsius 36.5  Temperature Source oral  Pulse Pulse 79  Respirations Respirations 18  Systolic BP Systolic BP 017  Diastolic BP (mmHg) Diastolic BP (mmHg) 86  Mean BP 99  Pulse Ox % Pulse Ox % 100   Pulse Ox Activity Level  At rest  Oxygen Delivery Room Air/ 21 %    14:25  Vital Signs Type Routine  Temperature Temperature (F) 97.9  Celsius 36.6  Temperature Source oral  Pulse Pulse 85  Respirations Respirations 18  Systolic BP Systolic BP 793  Diastolic BP (mmHg) Diastolic BP (mmHg) 80  Mean BP 93  Pulse Ox % Pulse Ox % 98  Pulse Ox Activity Level  At rest  Oxygen Delivery Room Air/ 21 %    21:35  Vital Signs Type Routine  Temperature Temperature (F) 98.6  Celsius 37  Temperature Source oral  Pulse Pulse 89  Respirations Respirations 18  Systolic BP Systolic BP 903  Diastolic BP (mmHg) Diastolic BP (mmHg) 90  Mean BP 101  Pulse Ox % Pulse Ox % 99  Pulse Ox Activity Level  At rest  Oxygen Delivery Room Air/ 21 %   Lab Results:  Hepatic:  31-Mar-14 17:06   Bilirubin, Total 0.5  Alkaline Phosphatase 93  SGPT (ALT)  85  SGOT (AST)  45  Total Protein, Serum  8.4  Albumin, Serum 4.3  Routine Chem:  31-Mar-14 17:06   Glucose, Serum 94  BUN 15  Creatinine (comp) 0.71  Sodium, Serum  135  Potassium, Serum 3.6  Chloride, Serum 101  CO2, Serum 27  Calcium (Total), Serum 8.9  Osmolality (calc) 271  eGFR (African American) >60  eGFR (Non-African American) >60 (eGFR values <47m/min/1.73 m2 may be an indication of chronic kidney disease (CKD). Calculated eGFR is useful in patients with stable renal function. The eGFR calculation will not be reliable in acutely ill patients when serum creatinine is changing rapidly. It is not useful in  patients on dialysis. The eGFR calculation may not be applicable to patients at the low and high extremes of body sizes, pregnant women, and vegetarians.)  Anion Gap 7  Urine Drugs:  [id number]  Tricyclic Antidepressant, Ur Qual (comp) NEGATIVE (Result(s) reported on 05 Oct 2012 at 08:43PM.)  Amphetamines, Urine Qual. NEGATIVE  MDMA, Urine Qual. NEGATIVE  Cocaine Metabolite, Urine Qual. NEGATIVE  Opiate, Urine qual  POSITIVE  Phencyclidine, Urine Qual. NEGATIVE  Cannabinoid, Urine Qual. NEGATIVE  Barbiturates, Urine Qual. NEGATIVE  Benzodiazepine, Urine Qual. NEGATIVE (----------------- The URINE DRUG SCREEN provides only a preliminary, unconfirmed analytical test result and should not be used for non-medical  purposes.  Clinical consideration and professional judgment should be  applied to any positive drug screen result due to possible interfering substances.  A more specific alternate chemical method must be used in order to obtain a confirmed analytical result.  Gas chromatography/mass spectrometry (GC/MS) is the preferred confirmatory method.)  Methadone, Urine Qual. NEGATIVE  Routine Hem:  31-Mar-14 17:06   Erythrocyte Sed Rate 1 (Result(s) reported on 04 Oct 2012 at 08:30PM.)  WBC (CBC) 10.5  RBC (CBC) 4.69  Hemoglobin (CBC) 14.6  Hematocrit (CBC) 43.1  Platelet Count (CBC) 323 (Result(s) reported on 04 Oct 2012 at 06:01PM.)  MCV 92  MCH 31.1  MCHC 33.8  RDW 12.4   Radiology Results: CT:    31-Mar-14 17:14, CT Head Without Contrast  CT Head Without Contrast   REASON FOR EXAM:    c/o head pain, blurred vision, bilaterial numbness.  COMMENTS:       PROCEDURE: CT  - CT HEAD WITHOUT CONTRAST  - Oct 04 2012  5:14PM     RESULT: Comparison:  None    Technique: Multiple axial images from the foramen magnum to thevertex   were obtained without IV contrast.    Findings:      There is no evidence of mass effect, midline shift, or extra-axial fluid   collections.  There is no evidence of a space-occupying lesion or   intracranial hemorrhage. There is no evidenceof a cortical-based area of     acute infarction.      The ventricles and sulci are appropriate for the patient's age. The basal   cisterns are patent.    Visualized portions of the orbits are unremarkable. The visualized   portions of the paranasal sinuses and mastoid air cells are unremarkable.     The osseous  structures are unremarkable.    IMPRESSION:      No acute intracranial  process.    Dictation Site: 1        Verified By: Jennette Banker, M.D., MD   Electronic Signatures: Anabel Bene (MD)  (Signed 02-Apr-14 02:11)  Authored: REFERRING PHYSICIAN, Primary Care Physician, Consult, History of Present Illness, Review of Systems, PAST MEDICAL/SURGICAL HISTORY, HOME MEDICATIONS, Current Medications, ALLERGIES, NURSING VITAL SIGNS, LAB RESULTS, RADIOLOGY RESULTS   Last Updated: 02-Apr-14 02:11 by Anabel Bene (MD)

## 2014-10-27 NOTE — H&P (Signed)
PATIENT NAME:  Cynthia Kramer, Cynthia Kramer MR#:  [id number] DATE OF BIRTH:  [birth year]  DATE OF ADMISSION:  10/04/2012  PRIMARY CARE PHYSICIAN:  [facility].   CHIEF COMPLAINT:  Headache, numbness and tingling and blurry vision.   HISTORY OF PRESENT ILLNESS:  This is a 44 year old female who presents to the hospital complaining of upper extremity numbness and tingling, a headache and also some blurry vision. The patient says her symptoms began a few days back when she just developed some blurry vision and all of a sudden she lost vision in her right eye. It transiently came back a few hours later. She went to see her primary care physician at the [facility] who referred her to get an MRI as an outpatient and also told her that if her symptoms got any worse to come to the ER for further evaluation. Over the weekend, the patient has developed some worsening upper extremity numbness and tingling and also continues to have some transient blurry vision and at some point while driving, she had some loss of vision again. She therefore came to the ER for further evaluation. Presently, she continues to complain of numbness and tingling and a mild headache, but her vision has improved since then. Hospitalist service was contacted for treatment and evaluation.   REVIEW OF SYSTEMS:   CONSTITUTIONAL: No documented fever. No weight gain or weight loss.  EYES: No double vision.  ENT: No tinnitus. No postnasal drip. No redness of oropharynx.  RESPIRATORY: No cough, no wheeze. No hemoptysis.  CARDIOVASCULAR: No chest pain, no orthopnea, no palpitations, no syncope.  GASTROINTESTINAL: No nausea. No vomiting, no diarrhea. No abdominal pain, no melena, no hematochezia.  GENITOURINARY: No dysuria or hematuria.  ENDOCRINE: No polyuria or nocturia. No heat or cold intolerance.  HEMATOLOGIC: No anemia, no bruising, no bleeding.  INTEGUMENTARY: No rashes. No lesions.  MUSCULOSKELETAL: No arthritis, swelling or gout.   NEUROLOGIC: Positive numbness. Positive tingling. No ataxia. No seizure-type activity.  PSYCHIATRIC: No anxiety, no insomnia, no ADD. Positive depression.   PAST MEDICAL HISTORY:  Consistent with depression, hypertension, GERD.   ALLERGIES:  No known drug allergies.   SOCIAL HISTORY:  Used to be a smoker, quit about a couple of years back. No alcohol abuse. No illicit drug abuse. Lives at home with her fiance and her 2 daughters.   FAMILY HISTORY:  No significant family history of multiple sclerosis. The patient cannot recall her father's history. Her mother is alive, only has high blood pressure.   CURRENT MEDICATIONS:  Nexium 40 mg daily, cetirizine 10 mg daily, Norco 5/325 mg 1 to 2 tabs q. 6 hours as needed, hydrochlorothiazide 25 mg daily and Flonase 1 spray to each nostril daily.   PHYSICAL EXAMINATION:  VITAL SIGNS: On admission is as follows: Temperature 98.2, pulse 92, respirations 18, blood pressure 145/85 and saturation 100% on room air.  GENERAL: She is a pleasant-appearing female who appears a bit anxious, but in no apparent distress.  HEENT: She is atraumatic, normocephalic. Her extraocular muscles are intact. Pupils equal and reactive to light. Sclerae are anicteric. No conjunctival injection. No oropharyngeal erythema.  NECK: Supple. There is no jugular venous distention, no bruits, no lymphadenopathy or thyromegaly.  HEART: Regular rate and rhythm. No murmurs, rubs or clicks.  LUNGS: Clear to auscultation bilaterally. No rales, no rhonchi, no wheezes.  ABDOMEN: Soft, flat, nontender, nondistended. Has good bowel sounds. No hepatosplenomegaly appreciated.  EXTREMITIES: No evidence of any cyanosis, clubbing or peripheral edema.  Has +2 pedal and radial pulses bilaterally.  NEUROLOGIC: The patient is alert, awake and oriented x 3. No focal, motor or sensory deficits appreciated bilaterally.  SKIN: Moist and warm with no rash appreciated.  LYMPHATIC: There is no cervical or  axillary lymphadenopathy.   DIAGNOSTIC DATA:  Showed serum glucose of 94, BUN 15, creatinine 0.7, sodium 135, potassium 3.6, chloride 101 and bicarbonate 27. LFTs are mildly elevated with AST 45 and ALT 85. White cell count is 10.5, hemoglobin 14.6, hematocrit 43, platelet count 323. Sedimentation rate is 1. The patient had a CT of the head done without contrast which showed no evidence of any acute intracranial process,   ASSESSMENT AND PLAN:  This is a 44 year old female with a history of depression, hypertension, gastroesophageal reflux disease who presents to the hospital with blurry vision, headache and bilateral upper extremity numbness and tingling.  1.  Blurry vision, headache and numbness and tingling. The exact etiology of this is unclear, but suspicious for multiple sclerosis as the patient's symptoms have been episodic and transient. She continues to have a persistent headache and some numbness and tingling in upper extremities still. She has had 2 CT heads in the past 2 weeks which have been negative. I will go ahead and get an MRI of her brain with and without contrast and get a neurology consult. The patient's sedimentation rate is normal presently. We will follow q. 4 hour neuro checks.  2.  Hypertension. Continue hydrochlorothiazide.  3.  Gastroesophageal reflux disease. Continue Nexium.  4.  Depression. The patient was admitted to [facility] in August of last year for suicidal ideations. Presently, she denies any suicidal ideations or homicidal ideations, although she was crying at one point during the exam. I would consider a psychiatry consult if her MRI is negative, but that can also be done as an outpatient.   CODE STATUS:  THE PATIENT IS A FULL CODE.   TIME SPENT:  50 minutes.    ____________________________ Rolly PancakeVivek J. Cherlynn KaiserSainani, MD vjs:si D: 10/04/2012 21:04:53 ET T: 10/04/2012 21:40:38 ET JOB#: [id number]  cc: Rolly PancakeVivek J. Cherlynn KaiserSainani, MD, <Dictator> Houston SirenVIVEK J Shaunae Sieloff  MD ELECTRONICALLY SIGNED 10/06/2012 21:18

## 2014-11-14 ENCOUNTER — Emergency Department: Payer: Medicaid Other

## 2014-11-14 ENCOUNTER — Encounter: Payer: Self-pay | Admitting: Medical Oncology

## 2014-11-14 ENCOUNTER — Emergency Department
Admission: EM | Admit: 2014-11-14 | Discharge: 2014-11-14 | Discharge status: Against Medical Advice | Payer: Medicaid Other | Attending: Emergency Medicine | Admitting: Emergency Medicine

## 2014-11-14 DIAGNOSIS — I10 Essential (primary) hypertension: Secondary | ICD-10-CM | POA: Insufficient documentation

## 2014-11-14 DIAGNOSIS — M79605 Pain in left leg: Secondary | ICD-10-CM | POA: Diagnosis not present

## 2014-11-14 DIAGNOSIS — R0602 Shortness of breath: Secondary | ICD-10-CM | POA: Insufficient documentation

## 2014-11-14 DIAGNOSIS — R51 Headache: Secondary | ICD-10-CM | POA: Diagnosis not present

## 2014-11-14 DIAGNOSIS — R519 Headache, unspecified: Secondary | ICD-10-CM

## 2014-11-14 DIAGNOSIS — R2242 Localized swelling, mass and lump, left lower limb: Secondary | ICD-10-CM | POA: Diagnosis not present

## 2014-11-14 HISTORY — DX: Essential (primary) hypertension: I10

## 2014-11-14 LAB — CBC
HEMATOCRIT: 39.5 % (ref 35.0–47.0)
Hemoglobin: 13.3 g/dL (ref 12.0–16.0)
MCH: 31 pg (ref 26.0–34.0)
MCHC: 33.7 g/dL (ref 32.0–36.0)
MCV: 91.9 fL (ref 80.0–100.0)
PLATELETS: 335 10*3/uL (ref 150–440)
RBC: 4.3 MIL/uL (ref 3.80–5.20)
RDW: 13.1 % (ref 11.5–14.5)
WBC: 11.9 10*3/uL — ABNORMAL HIGH (ref 3.6–11.0)

## 2014-11-14 LAB — BASIC METABOLIC PANEL
ANION GAP: 6 (ref 5–15)
BUN: 16 mg/dL (ref 6–20)
CHLORIDE: 107 mmol/L (ref 101–111)
CO2: 27 mmol/L (ref 22–32)
Calcium: 8.5 mg/dL — ABNORMAL LOW (ref 8.9–10.3)
Creatinine, Ser: 0.77 mg/dL (ref 0.44–1.00)
GFR calc Af Amer: >60 mL/min (ref >60)
GFR calc non Af Amer: >60 mL/min (ref >60)
Glucose, Bld: 77 mg/dL (ref 65–99)
Potassium: 3.4 mmol/L — ABNORMAL LOW (ref 3.5–5.1)
Sodium: 140 mmol/L (ref 135–145)

## 2014-11-14 NOTE — ED Notes (Signed)
No answer when called from lobby 

## 2014-11-14 NOTE — ED Notes (Signed)
Pt reports that she has had a "knot" with swelling to left upper thigh for approx 8 days, also reports headache and difficulty breathing.

## 2014-11-29 ENCOUNTER — Emergency Department: Payer: Medicaid Other

## 2014-11-29 ENCOUNTER — Emergency Department
Admission: EM | Admit: 2014-11-29 | Discharge: 2014-11-29 | Disposition: A | Discharge status: Home / Self Care | Payer: Medicaid Other | Attending: Emergency Medicine | Admitting: Emergency Medicine

## 2014-11-29 ENCOUNTER — Encounter: Payer: Self-pay | Admitting: Emergency Medicine

## 2014-11-29 DIAGNOSIS — I159 Secondary hypertension, unspecified: Secondary | ICD-10-CM

## 2014-11-29 DIAGNOSIS — R51 Headache: Secondary | ICD-10-CM | POA: Diagnosis present

## 2014-11-29 MED ORDER — PROCHLORPERAZINE EDISYLATE 5 MG/ML IJ SOLN
INTRAMUSCULAR | Status: AC
  Filled 2014-11-29: qty 2

## 2014-11-29 NOTE — ED Notes (Addendum)
PT REPORTS SHE HAS BEEN HAVING THESE SYMPTOMS FOR ABOUT A MONTH, PT REPORTS SHE WAS SEEN IN ER IN THE PAST AND WAS DIAGNOSED WITH PANIC ATTACK. PT REPORTS SHE FEELS LIKE HER BLOOD PRESSURE IS UP AND SHE DEVELOPED A HEADACHE, PT REFUSED MEDICATION FOR HEADACHE. "I AM NOT HERE FOR THE HEADACHE SOMETHING IS WRONG AND I NEED TO SEE A DR"

## 2014-11-29 NOTE — ED Notes (Signed)
NO E-SIGNATURE

## 2014-11-29 NOTE — ED Notes (Addendum)
PT LEFT EXAM ROOM AMBULATORY NO DISTRESS NOTED.  DR. Derrill KayGOODMAN TALKED TO PT ABOUT DISCHARGE INSTRUCTIONS, PT LEFT EXAM ROOM PRIOR TO RECEIVE PAPER INSTRUCTIONS. PT LEFT.

## 2014-11-29 NOTE — ED Provider Notes (Signed)
[facility] Emergency Department Provider Note    ____________________________________________  Time seen: 2145  I have reviewed the triage vital signs and the nursing notes.   HISTORY  Chief Complaint Headache   History limited by: Not Limited   HPI Cynthia Kramer is a 44 y.o. female who presents to the emergency department today after an episode of dizziness, difficulty speaking, difficulty with vision, difficulty with hearing that occurred today. Patient states that she was walking to her car when the symptoms started rather abruptly. She states the intensive symptoms lasted maybe a couple minutes. She then started gradually getting better. At the time of examination the symptoms have resolved. Patient states she had a similar symptom yesterday and then has had similar symptoms in the past. She thinks it is related to her blood pressure as her blood pressure was really high recently when she has had these symptoms. States she went to primary care doctor who doubled her hydrochlorothiazide. Denies loss of consciousness. Denies recent fevers.     Past Medical History  Diagnosis Date  . Hypertension     There are no active problems to display for this patient.   Past Surgical History  Procedure Laterality Date  . Tonsillectomy    . Cholecystectomy    . Abdominal hysterectomy      No current outpatient prescriptions on file.  Allergies Phenergan and Ultram  No family history on file.  Social History History  Substance Use Topics  . Smoking status: Never Smoker   . Smokeless tobacco: Not on file  . Alcohol Use: Yes     Comment: socially    Review of Systems  Constitutional: Negative for fever. Cardiovascular: Negative for chest pain. Respiratory: Negative for shortness of breath. Gastrointestinal: Negative for abdominal pain, vomiting and diarrhea. Genitourinary: Negative for dysuria. Musculoskeletal: Negative for back pain. Skin:  Negative for rash. Neurological: Positive for headaches   10-point ROS otherwise negative.  ____________________________________________   PHYSICAL EXAM:  VITAL SIGNS: ED Triage Vitals  Enc Vitals Group     BP 11/29/14 1838 144/48 mmHg     Pulse Rate 11/29/14 1838 107     Resp 11/29/14 1838 20     Temp 11/29/14 1838 98.9 F (37.2 C)     Temp Source 11/29/14 1838 Oral     SpO2 11/29/14 1838 100 %     Weight 11/29/14 1838 167 lb (75.751 kg)     Height 11/29/14 1838  (1.651 m)     Head Cir --      Peak Flow --      Pain Score 11/29/14 1839 9   Constitutional: Alert and oriented. Well appearing and in no distress. Eyes: Conjunctivae are normal. PERRL. Normal extraocular movements. ENT   Head: Normocephalic and atraumatic.   Nose: No congestion/rhinnorhea.   Mouth/Throat: Mucous membranes are moist.   Neck: No stridor. Hematological/Lymphatic/Immunilogical: No cervical lymphadenopathy. Cardiovascular: Normal rate, regular rhythm.  No murmurs, rubs, or gallops. Respiratory: Normal respiratory effort without tachypnea nor retractions. Breath sounds are clear and equal bilaterally. No wheezes/rales/rhonchi. Gastrointestinal: Soft and nontender. No distention.  Genitourinary: Deferred Musculoskeletal: Normal range of motion in all extremities. No joint effusions.  No lower extremity tenderness nor edema. Neurologic:  Normal speech and language. No gross focal neurologic deficits are appreciated. Speech is normal.  Skin:  Skin is warm, dry and intact. No rash noted. Psychiatric: Mood and affect are normal. Speech and behavior are normal. Patient exhibits appropriate insight and judgment.  ____________________________________________    LABS (pertinent positives/negatives)  None  ____________________________________________   EKG  None  ____________________________________________    RADIOLOGY  CT head IMPRESSION: Normal head  CT. ____________________________________________   PROCEDURES  Procedure(s) performed: None  Critical Care performed: No  ____________________________________________   INITIAL IMPRESSION / ASSESSMENT AND PLAN / ED COURSE  Pertinent labs & imaging results that were available during my care of the patient were reviewed by me and considered in my medical decision making (see chart for details).  Patient here after episode of headache, dizziness, vision change and hearing change. The symptoms have now resolved. On exam no concerning physical findings. Head CT was negative. Unclear etiology of symptoms however given clinical story I doubt seizure, doubt TIA. Discussed with patient that she will need to follow up with her primary care doctor. Also recommended that she speak to cardiologist and neurologist. Patient verbalized understanding. No discharge.  ____________________________________________   FINAL CLINICAL IMPRESSION(S) / ED DIAGNOSES  Final diagnoses:  Secondary hypertension, unspecified     Phineas SemenGraydon Sieanna Vanstone, MD 11/29/14 2238

## 2014-11-29 NOTE — ED Notes (Signed)
Pt reports headache, difficulty speaking and bilateral ear pain that started at 5pm today with n/v.

## 2014-11-29 NOTE — Discharge Instructions (Signed)
Please seek medical attention for any high fevers, chest pain, shortness of breath, change in behavior, persistent vomiting, bloody stool or any other new or concerning symptoms. ° °Hypertension °Hypertension, commonly called high blood pressure, is when the force of blood pumping through your arteries is too strong. Your arteries are the blood vessels that carry blood from your heart throughout your body. A blood pressure reading consists of a higher number over a lower number, such as 110/72. The higher number (systolic) is the pressure inside your arteries when your heart pumps. The lower number (diastolic) is the pressure inside your arteries when your heart relaxes. Ideally you want your blood pressure below 120/80. °Hypertension forces your heart to work harder to pump blood. Your arteries may become narrow or stiff. Having hypertension puts you at risk for heart disease, stroke, and other problems.  °RISK FACTORS °Some risk factors for high blood pressure are controllable. Others are not.  °Risk factors you cannot control include:  °· Race. You may be at higher risk if you are African American. °· Age. Risk increases with age. °· Gender. Men are at higher risk than women before age 45 years. After age 65, women are at higher risk than men. °Risk factors you can control include: °· Not getting enough exercise or physical activity. °· Being overweight. °· Getting too much fat, sugar, calories, or salt in your diet. °· Drinking too much alcohol. °SIGNS AND SYMPTOMS °Hypertension does not usually cause signs or symptoms. Extremely high blood pressure (hypertensive crisis) may cause headache, anxiety, shortness of breath, and nosebleed. °DIAGNOSIS  °To check if you have hypertension, your health care provider will measure your blood pressure while you are seated, with your arm held at the level of your heart. It should be measured at least twice using the same arm. Certain conditions can cause a difference in  blood pressure between your right and left arms. A blood pressure reading that is higher than normal on one occasion does not mean that you need treatment. If one blood pressure reading is high, ask your health care provider about having it checked again. °TREATMENT  °Treating high blood pressure includes making lifestyle changes and possibly taking medicine. Living a healthy lifestyle can help lower high blood pressure. You may need to change some of your habits. °Lifestyle changes may include: °· Following the DASH diet. This diet is high in fruits, vegetables, and whole grains. It is low in salt, red meat, and added sugars. °· Getting at least 2½ hours of brisk physical activity every week. °· Losing weight if necessary. °· Not smoking. °· Limiting alcoholic beverages. °· Learning ways to reduce stress. ° If lifestyle changes are not enough to get your blood pressure under control, your health care provider may prescribe medicine. You may need to take more than one. Work closely with your health care provider to understand the risks and benefits. °HOME CARE INSTRUCTIONS °· Have your blood pressure rechecked as directed by your health care provider.   °· Take medicines only as directed by your health care provider. Follow the directions carefully. Blood pressure medicines must be taken as prescribed. The medicine does not work as well when you skip doses. Skipping doses also puts you at risk for problems.   °· Do not smoke.   °· Monitor your blood pressure at home as directed by your health care provider.  °SEEK MEDICAL CARE IF:  °· You think you are having a reaction to medicines taken. °· You have recurrent headaches or feel   dizzy. °· You have swelling in your ankles. °· You have trouble with your vision. °SEEK IMMEDIATE MEDICAL CARE IF: °· You develop a severe headache or confusion. °· You have unusual weakness, numbness, or feel faint. °· You have severe chest or abdominal pain. °· You vomit repeatedly. °· You  have trouble breathing. °MAKE SURE YOU:  °· Understand these instructions. °· Will watch your condition. °· Will get help right away if you are not doing well or get worse. °Document Released: 06/23/2005 Document Revised: 11/07/2013 Document Reviewed: 04/15/2013 °[employer]® Patient Information ©2015 [employer]. This information is not intended to replace advice given to you by your health care provider. Make sure you discuss any questions you have with your health care provider. ° °

## 2015-01-13 ENCOUNTER — Encounter: Payer: Self-pay | Admitting: General Practice

## 2015-01-13 ENCOUNTER — Emergency Department: Payer: Medicaid Other

## 2015-01-13 ENCOUNTER — Emergency Department
Admission: EM | Admit: 2015-01-13 | Discharge: 2015-01-13 | Disposition: A | Discharge status: Home / Self Care | Payer: Medicaid Other | Attending: Emergency Medicine | Admitting: Emergency Medicine

## 2015-01-13 DIAGNOSIS — Y9389 Activity, other specified: Secondary | ICD-10-CM | POA: Diagnosis not present

## 2015-01-13 DIAGNOSIS — Y9289 Other specified places as the place of occurrence of the external cause: Secondary | ICD-10-CM | POA: Diagnosis not present

## 2015-01-13 DIAGNOSIS — W010XXA Fall on same level from slipping, tripping and stumbling without subsequent striking against object, initial encounter: Secondary | ICD-10-CM | POA: Insufficient documentation

## 2015-01-13 DIAGNOSIS — I10 Essential (primary) hypertension: Secondary | ICD-10-CM | POA: Insufficient documentation

## 2015-01-13 DIAGNOSIS — S63112D Subluxation of metacarpophalangeal joint of left thumb, subsequent encounter: Secondary | ICD-10-CM | POA: Insufficient documentation

## 2015-01-13 DIAGNOSIS — S6992XA Unspecified injury of left wrist, hand and finger(s), initial encounter: Secondary | ICD-10-CM | POA: Diagnosis present

## 2015-01-13 DIAGNOSIS — S63102D Unspecified subluxation of left thumb, subsequent encounter: Secondary | ICD-10-CM

## 2015-01-13 DIAGNOSIS — Y998 Other external cause status: Secondary | ICD-10-CM | POA: Insufficient documentation

## 2015-01-13 DIAGNOSIS — S62172A Displaced fracture of trapezium [larger multangular], left wrist, initial encounter for closed fracture: Secondary | ICD-10-CM | POA: Diagnosis not present

## 2015-01-13 MED ORDER — OXYCODONE-ACETAMINOPHEN 5-325 MG PO TABS
ORAL_TABLET | ORAL | Status: AC
  Administered 2015-01-13: 1 via ORAL
  Filled 2015-01-13: qty 1

## 2015-01-13 MED ORDER — IBUPROFEN 800 MG PO TABS
ORAL_TABLET | ORAL | Status: AC
  Administered 2015-01-13: 800 mg via ORAL
  Filled 2015-01-13: qty 1

## 2015-01-13 MED ORDER — HYDROMORPHONE HCL 1 MG/ML IJ SOLN
INTRAMUSCULAR | Status: AC
  Administered 2015-01-13: 1 mg via INTRAMUSCULAR
  Filled 2015-01-13: qty 1

## 2015-01-13 MED ORDER — OXYCODONE-ACETAMINOPHEN 5-325 MG PO TABS
1.0000 | ORAL_TABLET | Freq: Once | ORAL | Status: AC
Start: 2015-01-13 — End: 2015-01-13
  Administered 2015-01-13: 1 via ORAL

## 2015-01-13 MED ORDER — IBUPROFEN 800 MG PO TABS: 800.0000 mg | ORAL_TABLET | Freq: Three times a day (TID) | ORAL | Status: DC | PRN

## 2015-01-13 MED ORDER — IBUPROFEN 800 MG PO TABS
800.0000 mg | ORAL_TABLET | Freq: Once | ORAL | Status: AC
  Administered 2015-01-13: 800 mg via ORAL

## 2015-01-13 MED ORDER — OXYCODONE-ACETAMINOPHEN 7.5-325 MG PO TABS: 1.0000 | ORAL_TABLET | ORAL | Status: AC | PRN

## 2015-01-13 MED ORDER — HYDROMORPHONE HCL 1 MG/ML IJ SOLN
1.0000 mg | Freq: Once | INTRAMUSCULAR | Status: AC
  Administered 2015-01-13: 1 mg via INTRAMUSCULAR

## 2015-01-13 NOTE — ED Notes (Signed)
Pt. Arrived to ed from home with reports of experiencing left thumb pain due to re-injury of site last night. Pt reports hx of having thumb operated on and fell hand last night. Deformity noted to left thumb. Radial Pulse intact on left side. Pt favoring hand. Pt alert and oriented.

## 2015-01-13 NOTE — Discharge Instructions (Signed)
Wear splint until evaluation by [facility]. Clinic call Monday morning for appointment.

## 2015-01-13 NOTE — ED Provider Notes (Signed)
[facility] Emergency Department Provider Note  ____________________________________________  Time seen: Approximately 9:50 AM  I have reviewed the triage vital signs and the nursing notes.   HISTORY  Chief Complaint Hand Injury    HPI Cynthia Kramer is a 44 y.o. female patient complaining of left thumb pain and deformity secondary to a fall. Patient states she slipped and fell last night in the rain. She notes that this forms to the left thumb which was unable to reduce. Patient had a similar formally to the left thumb 6 months ago. Which required surgical reduction and internal fixation. Patient states the internal fixation is most February 2016 but she is always felt that the thumb was unstable. Patient did not report her concern to orthopedic panic. Patient is currently rating the pain as a 9/10.  Past Medical History  Diagnosis Date  . Hypertension     There are no active problems to display for this patient.   Past Surgical History  Procedure Laterality Date  . Tonsillectomy    . Cholecystectomy    . Abdominal hysterectomy      Current Outpatient Rx  Name  Route  Sig  Dispense  Refill  . ibuprofen (ADVIL,MOTRIN) 800 MG tablet   Oral   Take 1 tablet (800 mg total) by mouth every 8 (eight) hours as needed for moderate pain.   15 tablet   0   . oxyCODONE-acetaminophen (PERCOCET) 7.5-325 MG per tablet   Oral   Take 1 tablet by mouth every 4 (four) hours as needed for severe pain.   20 tablet   0     Allergies Phenergan and Ultram  No family history on file.  Social History History  Substance Use Topics  . Smoking status: Never Smoker   . Smokeless tobacco: Not on file  . Alcohol Use: Yes     Comment: socially    Review of Systems Constitutional: No fever/chills Eyes: No visual changes. ENT: No sore throat. Cardiovascular: Denies chest pain. Respiratory: Denies shortness of breath. Gastrointestinal: No abdominal pain.  No  nausea, no vomiting.  No diarrhea.  No constipation. Genitourinary: Negative for dysuria. Musculoskeletal: Negative for back pain. Deformity left thumb. Skin: Negative for rash. Neurological: Negative for headaches, focal weakness or numbness. 10-point ROS otherwise negative.  ____________________________________________   PHYSICAL EXAM:  VITAL SIGNS: ED Triage Vitals  Enc Vitals Group     BP 01/13/15 0939 145/99 mmHg     Pulse Rate 01/13/15 0939 97     Resp 01/13/15 0939 18     Temp 01/13/15 0939 98.1 F (36.7 C)     Temp src --      SpO2 01/13/15 0939 98 %     Weight 01/13/15 0939 160 lb (72.576 kg)     Height 01/13/15 0939  (1.651 m)     Head Cir --      Peak Flow --      Pain Score 01/13/15 0940 9     Pain Loc --      Pain Edu? --      Excl. in GC? --     Constitutional: Alert and oriented. Well appearing and in no acute distress. Eyes: Conjunctivae are normal. PERRL. EOMI. Head: Atraumatic. Nose: No congestion/rhinnorhea. Mouth/Throat: Mucous membranes are moist.  Oropharynx non-erythematous. Neck: No stridor.  No cervical spine tenderness to palpation. Hematological/Lymphatic/Immunilogical: No cervical lymphadenopathy. Cardiovascular: Normal rate, regular rhythm. Grossly normal heart sounds.  Good peripheral circulation. Elevation of blood pressure.  Respiratory: Normal respiratory effort.  No retractions. Lungs CTAB. Gastrointestinal: Soft and nontender. No distention. No abdominal bruits. No CVA tenderness. Musculoskeletal: Left thumb deformity. Neurovascular intact. Neurologic:  Normal speech and language. No gross focal neurologic deficits are appreciated. Speech is normal. No gait instability. Skin:  Skin is warm, dry and intact. No rash noted. Psychiatric: Mood and affect are normal. Speech and behavior are normal.  ____________________________________________   LABS (all labs ordered are listed, but only abnormal results are displayed)  Labs  Reviewed - No data to display ____________________________________________  EKG   ____________________________________________  RADIOLOGY  Sublux left MCP with Trapezium fracture. I, Joni Reiningonald K Marsheila Alejo, personally viewed and evaluated these images as part of my medical decision making.   ____________________________________________   PROCEDURES  Procedure(s) performed: None  Critical Care performed: No  ____________________________________________   INITIAL IMPRESSION / ASSESSMENT AND PLAN / ED COURSE  Pertinent labs & imaging results that were available during my care of the patient were reviewed by me and considered in my medical decision making (see chart for details).  Due to severe of this left thumb subluxation and a prior history required internal fixationOrtho  will be consulted. While waiting for orthopedic consult patient relates that she does not want to have a digital block and manual reduction. Requests to follow up to orthopedics. Per discussion with Dr. Ernest PineHooten patient were placed in a spica splint given pain medication and follow-up with orthopedic of choice. ____________________________________________   FINAL CLINICAL IMPRESSION(S) / ED DIAGNOSES  Final diagnoses:  Subluxation of left thumb, subsequent encounter  Trapezium fracture, left, closed, initial encounter      Joni ReiningRonald K Vicie Cech, PA-C 01/13/15 1205  Minna AntisKevin Paduchowski, MD 01/13/15 [phone]

## 2015-02-07 ENCOUNTER — Ambulatory Visit
Admission: EM | Admit: 2015-02-07 | Discharge: 2015-02-07 | Disposition: A | Discharge status: Home / Self Care | Payer: Medicaid Other | Attending: Family Medicine | Admitting: Family Medicine

## 2015-02-07 DIAGNOSIS — R21 Rash and other nonspecific skin eruption: Secondary | ICD-10-CM | POA: Diagnosis not present

## 2015-02-07 DIAGNOSIS — R059 Cough, unspecified: Secondary | ICD-10-CM

## 2015-02-07 DIAGNOSIS — R05 Cough: Secondary | ICD-10-CM | POA: Diagnosis present

## 2015-02-07 LAB — CBC WITH DIFFERENTIAL/PLATELET
BASOS PCT: 1 %
Basophils Absolute: 0.1 10*3/uL (ref 0–0.1)
EOS PCT: 1 %
Eosinophils Absolute: 0.2 10*3/uL (ref 0–0.7)
HCT: 39.1 % (ref 35.0–47.0)
Hemoglobin: 13.3 g/dL (ref 12.0–16.0)
LYMPHS PCT: 21 %
Lymphs Abs: 2.6 10*3/uL (ref 1.0–3.6)
MCH: 30.2 pg (ref 26.0–34.0)
MCHC: 34.1 g/dL (ref 32.0–36.0)
MCV: 88.6 fL (ref 80.0–100.0)
Monocytes Absolute: 0.6 10*3/uL (ref 0.2–0.9)
Monocytes Relative: 5 %
Neutro Abs: 8.9 10*3/uL — ABNORMAL HIGH (ref 1.4–6.5)
Neutrophils Relative %: 72 %
Platelets: 313 10*3/uL (ref 150–440)
RBC: 4.41 MIL/uL (ref 3.80–5.20)
RDW: 12.7 % (ref 11.5–14.5)
WBC: 12.3 10*3/uL — ABNORMAL HIGH (ref 3.6–11.0)

## 2015-02-07 LAB — BASIC METABOLIC PANEL
Anion gap: 11 (ref 5–15)
BUN: 12 mg/dL (ref 6–20)
CO2: 24 mmol/L (ref 22–32)
CREATININE: 0.85 mg/dL (ref 0.44–1.00)
Calcium: 9.3 mg/dL (ref 8.9–10.3)
Chloride: 102 mmol/L (ref 101–111)
GFR calc Af Amer: >60 mL/min (ref >60)
Glucose, Bld: 111 mg/dL — ABNORMAL HIGH (ref 65–99)
Potassium: 3.2 mmol/L — ABNORMAL LOW (ref 3.5–5.1)
SODIUM: 137 mmol/L (ref 135–145)

## 2015-02-07 MED ORDER — DOXYCYCLINE HYCLATE 100 MG PO CAPS: 100.0000 mg | ORAL_CAPSULE | Freq: Two times a day (BID) | ORAL | Status: DC

## 2015-02-07 NOTE — ED Provider Notes (Signed)
Patient presents today with symptoms of cough for the last week. Patient has also noticed a rash that has developed on her body. She admits to a tick bite on her back that she pulled off about 6 weeks ago. She also has a cast on her left arm/hand for fracture. She states that she recently poured Clorox down the cast due to the smell from the cast. The rash she has looks like insect bites and are predominantly on the left upper arm extending from the cast. The rash is also on her torso and she has one lesion on her right cheek. She denies any severe itchiness of the area. She she denies any fever, chest pain, shortness of breath, nausea, vomiting, severe headache. She does admit to some myalgias. She denies any history previously of tick borne illness.  ROS: Negative except mentioned above. Vitals as per Epic  GENERAL: NAD HEENT: no pharyngeal erythema, no exudate, no erythema of TMs, no cervical LAD RESP: CTA B CARD: RRR SKIN: Erythematous slightly raised circular lesions noted predominantly on the left upper arm, also has similar lesions on the torso predominant on the back and one lesion on the right cheek, there is no drainage from the sites, the lesions are on both sides and do cross the midline NEURO: CN II-XII grossly intact   A/P: Cough, rash, hx of tick bite - white blood cell count slightly elevated, Lyme and RMSF titers drawn, recommend patient have cast removed by orthopedics this week to see if there are any skin issues under cast, discussed replacement of potassium with bananas daily for a few days and then to have repeat potassium drawn, if symptoms do persist or worsen I have recommended that the patient seek medical attention.  Jolene Provost, MD 02/07/15 1350

## 2015-02-07 NOTE — ED Notes (Signed)
Also started with rash on arms and face

## 2015-02-07 NOTE — Discharge Instructions (Signed)
Recommend having cast removed this week by ortho to see what is going on with skin. If symptoms worsen go to the ER as discussed. Will await lyme and RMSF labs.

## 2015-02-07 NOTE — ED Notes (Signed)
States started with cough and congestion last week. Sore throat also with headache, diarrhea and "white tongue". Cough, worse at night

## 2015-02-08 LAB — B. BURGDORFI ANTIBODIES

## 2015-02-08 LAB — ROCKY MTN SPOTTED FVR ABS PNL(IGG+IGM)
RMSF IgG: NEGATIVE
RMSF IgM: 0.51 index (ref 0.00–0.89)

## 2015-02-19 DIAGNOSIS — K219 Gastro-esophageal reflux disease without esophagitis: Secondary | ICD-10-CM | POA: Insufficient documentation

## 2015-02-19 DIAGNOSIS — J302 Other seasonal allergic rhinitis: Secondary | ICD-10-CM | POA: Insufficient documentation

## 2015-02-19 DIAGNOSIS — Z8639 Personal history of other endocrine, nutritional and metabolic disease: Secondary | ICD-10-CM | POA: Insufficient documentation

## 2015-05-15 ENCOUNTER — Emergency Department: Payer: Medicaid Other

## 2015-05-15 ENCOUNTER — Encounter: Payer: Self-pay | Admitting: Emergency Medicine

## 2015-05-15 ENCOUNTER — Emergency Department
Admission: EM | Admit: 2015-05-15 | Discharge: 2015-05-15 | Discharge status: Against Medical Advice | Payer: Medicaid Other | Attending: Emergency Medicine | Admitting: Emergency Medicine

## 2015-05-15 DIAGNOSIS — R109 Unspecified abdominal pain: Secondary | ICD-10-CM | POA: Insufficient documentation

## 2015-05-15 DIAGNOSIS — Z792 Long term (current) use of antibiotics: Secondary | ICD-10-CM | POA: Diagnosis not present

## 2015-05-15 DIAGNOSIS — K625 Hemorrhage of anus and rectum: Secondary | ICD-10-CM | POA: Diagnosis not present

## 2015-05-15 DIAGNOSIS — I10 Essential (primary) hypertension: Secondary | ICD-10-CM | POA: Diagnosis not present

## 2015-05-15 HISTORY — DX: Gastro-esophageal reflux disease without esophagitis: K21.9

## 2015-05-15 LAB — URINALYSIS COMPLETE WITH MICROSCOPIC (ARMC ONLY)
BILIRUBIN URINE: NEGATIVE
Bacteria, UA: NONE SEEN
GLUCOSE, UA: NEGATIVE mg/dL
HGB URINE DIPSTICK: NEGATIVE
Ketones, ur: NEGATIVE mg/dL
Leukocytes, UA: NEGATIVE
NITRITE: NEGATIVE
Protein, ur: NEGATIVE mg/dL
RBC / HPF: NONE SEEN RBC/hpf (ref 0–5)
SPECIFIC GRAVITY, URINE: 1.004 — AB (ref 1.005–1.030)
pH: 6 (ref 5.0–8.0)

## 2015-05-15 LAB — COMPREHENSIVE METABOLIC PANEL
ALBUMIN: 4.7 g/dL (ref 3.5–5.0)
ALK PHOS: 71 U/L (ref 38–126)
ALT: 34 U/L (ref 14–54)
ANION GAP: 8 (ref 5–15)
AST: 31 U/L (ref 15–41)
BILIRUBIN TOTAL: 0.8 mg/dL (ref 0.3–1.2)
BUN: 12 mg/dL (ref 6–20)
CALCIUM: 9.6 mg/dL (ref 8.9–10.3)
CO2: 26 mmol/L (ref 22–32)
Chloride: 106 mmol/L (ref 101–111)
Creatinine, Ser: 0.77 mg/dL (ref 0.44–1.00)
GFR calc Af Amer: >60 mL/min (ref >60)
GFR calc non Af Amer: >60 mL/min (ref >60)
GLUCOSE: 107 mg/dL — AB (ref 65–99)
Potassium: 3.5 mmol/L (ref 3.5–5.1)
Sodium: 140 mmol/L (ref 135–145)
TOTAL PROTEIN: 7.9 g/dL (ref 6.5–8.1)

## 2015-05-15 LAB — CBC
HCT: 40.6 % (ref 35.0–47.0)
Hemoglobin: 13.6 g/dL (ref 12.0–16.0)
MCH: 29.9 pg (ref 26.0–34.0)
MCHC: 33.5 g/dL (ref 32.0–36.0)
MCV: 89.4 fL (ref 80.0–100.0)
PLATELETS: 305 10*3/uL (ref 150–440)
RBC: 4.55 MIL/uL (ref 3.80–5.20)
RDW: 12.8 % (ref 11.5–14.5)
WBC: 8.2 10*3/uL (ref 3.6–11.0)

## 2015-05-15 LAB — LIPASE, BLOOD: Lipase: 40 U/L (ref 11–51)

## 2015-05-15 NOTE — ED Notes (Signed)
Patient called out from  the room asking to have IV removed. ED tech went to room and patient had left. IV found in the trash.

## 2015-05-15 NOTE — ED Provider Notes (Signed)
[facility] Emergency Department Provider Note    ____________________________________________  Time seen: 1500  I have reviewed the triage vital signs and the nursing notes.   HISTORY  Chief Complaint Abdominal Pain and Rectal Bleeding   History limited by: Not Limited   HPI Cynthia Kramer is a 44 y.o. female who presents to the emergency department today with concerns for constipation, abdominal pain and rectal bleeding. The patient states that she has not had a good bowel movement in 10 days. She states she has not had any bowel movement in the past 5 days. Leading up to the past 5 days she was starting to have smaller pellet-like stool. She states at the same time she was developing worsening abdominal distention and discomfort. She then noticed some bright red blood rectally today.   Past Medical History  Diagnosis Date  . Hypertension   . GERD (gastroesophageal reflux disease)     There are no active problems to display for this patient.   Past Surgical History  Procedure Laterality Date  . Tonsillectomy    . Cholecystectomy    . Abdominal hysterectomy    . Hand surgery      Current Outpatient Rx  Name  Route  Sig  Dispense  Refill  . doxycycline (VIBRAMYCIN) 100 MG capsule   Oral   Take 1 capsule (100 mg total) by mouth 2 (two) times daily.   20 capsule   0   . ibuprofen (ADVIL,MOTRIN) 800 MG tablet   Oral   Take 1 tablet (800 mg total) by mouth every 8 (eight) hours as needed for moderate pain.   15 tablet   0   . oxyCODONE-acetaminophen (PERCOCET) 7.5-325 MG per tablet   Oral   Take 1 tablet by mouth every 4 (four) hours as needed for severe pain.   20 tablet   0     Allergies Phenergan and Ultram  History reviewed. No pertinent family history.  Social History Social History  Substance Use Topics  . Smoking status: Never Smoker   . Smokeless tobacco: None  . Alcohol Use: Yes     Comment: socially    Review of  Systems  Constitutional: Negative for fever. Cardiovascular: Negative for chest pain. Respiratory: Negative for shortness of breath. Gastrointestinal: Positive for abdominal pain Genitourinary: Negative for dysuria. Musculoskeletal: Negative for back pain. Skin: Negative for rash. Neurological: Negative for headaches, focal weakness or numbness.   10-point ROS otherwise negative.  ____________________________________________   PHYSICAL EXAM:  VITAL SIGNS: ED Triage Vitals  Enc Vitals Group     BP 05/15/15 1359 142/91 mmHg     Pulse Rate 05/15/15 1359 61     Resp 05/15/15 1359 20     Temp 05/15/15 1359 98.1 F (36.7 C)     Temp Source 05/15/15 1359 Oral     SpO2 05/15/15 1359 100 %     Weight --      Height --      Head Cir --      Peak Flow --      Pain Score 05/15/15 1340 9   Constitutional: Alert and oriented. Well appearing and in no distress. Eyes: Conjunctivae are normal. PERRL. Normal extraocular movements. ENT   Head: Normocephalic and atraumatic.   Nose: No congestion/rhinnorhea.   Mouth/Throat: Mucous membranes are moist.   Neck: No stridor. Hematological/Lymphatic/Immunilogical: No cervical lymphadenopathy. Cardiovascular: Normal rate, regular rhythm.  No murmurs, rubs, or gallops. Respiratory: Normal respiratory effort without tachypnea nor  retractions. Breath sounds are clear and equal bilaterally. No wheezes/rales/rhonchi. Gastrointestinal: Soft and nontender. No distention.  Genitourinary: Deferred Musculoskeletal: Normal range of motion in all extremities. No joint effusions.  No lower extremity tenderness nor edema. Neurologic:  Normal speech and language. No gross focal neurologic deficits are appreciated.  Skin:  Skin is warm, dry and intact. No rash noted. Psychiatric: Mood and affect are normal. Speech and behavior are normal. Patient exhibits appropriate insight and judgment.  ____________________________________________    LABS  (pertinent positives/negatives)  Labs Reviewed  COMPREHENSIVE METABOLIC PANEL - Abnormal; Notable for the following:    Glucose, Bld 107 (*)    All other components within normal limits  URINALYSIS COMPLETEWITH MICROSCOPIC ([facility] ONLY) - Abnormal; Notable for the following:    Color, Urine STRAW (*)    APPearance CLEAR (*)    Specific Gravity, Urine 1.004 (*)    Squamous Epithelial / LPF 0-5 (*)    All other components within normal limits  LIPASE, BLOOD  CBC     ____________________________________________   EKG  I, Phineas SemenGraydon Arlee Bossard, attending physician, personally viewed and interpreted this EKG  EKG Time: 1350 Rate: 88 Rhythm: NSR Axis: normal Intervals: qtc 433 QRS: narrow ST changes: no st elevation Impression: normal ekg ____________________________________________    RADIOLOGY  Abdominal x-ray IMPRESSION: Bowel gas pattern unremarkable. No obstruction or free air. Lungs clear.  I, Dontrelle Mazon, personally viewed and evaluated these images (plain radiographs) as part of my medical decision making. ____________________________________________   PROCEDURES  Procedure(s) performed: None  Critical Care performed: No  ____________________________________________   INITIAL IMPRESSION / ASSESSMENT AND PLAN / ED COURSE  Pertinent labs & imaging results that were available during my care of the patient were reviewed by me and considered in my medical decision making (see chart for details).  Patient presents because of concerns for abdominal pain distention and constipation. Abdominal x-ray without any concerning findings. Patient has been taking Percocet secondary to thumb injury and surgery. I discussed with patient that likely the opiates are causing her decreased bowel movements. Will try enemas here.  Patient left prior to my reevaluation after enemas and prior to my ability to reassess the  patient.  ____________________________________________   FINAL CLINICAL IMPRESSION(S) / ED DIAGNOSES  Final diagnoses:  Abdominal pain     Phineas SemenGraydon Sarahjane Matherly, MD 05/15/15 1906

## 2015-05-15 NOTE — ED Notes (Signed)
Patient had remaining enema bag and tolerated it well. Patient denies bowel movement with the first infusion.

## 2015-05-15 NOTE — ED Notes (Signed)
Cynthia Kramer was contacted who is the patient's daughter by Cynthia Kramer the Diplomatic Services operational officersecretary. Cynthia Kramer states that patient told her she took the IV out and left because she was tired of waiting. Dr. Derrill KayGoodman is aware.

## 2015-05-15 NOTE — ED Notes (Signed)
Patient tolerated approximately 1/4 bag of soap suds enema. Patient was instructed to remain lying down and hold in fluid for as long as possible or at least 10 minutes. Patient agreed to try. ED socks were placed on the patient, bed is in the lowest position and bed rail is down. Call light is within reach. Lid for toilet is raised.

## 2015-05-15 NOTE — ED Notes (Signed)
Pt called out reporting they have had three bowel movements since enema. Bowel movement noted in toilet. Pt verbalized wanting to go home.

## 2015-05-15 NOTE — ED Notes (Signed)
Pt to ed with c/o abd pain x 5 days, no bm x 10 days and rectal bleeding today.

## 2015-07-12 ENCOUNTER — Other Ambulatory Visit
Admission: RE | Admit: 2015-07-12 | Discharge: 2015-07-12 | Disposition: A | Discharge status: Home / Self Care | Payer: Medicaid Other | Source: Ambulatory Visit | Attending: Family Medicine | Admitting: Family Medicine

## 2015-07-12 DIAGNOSIS — J019 Acute sinusitis, unspecified: Secondary | ICD-10-CM | POA: Insufficient documentation

## 2015-07-12 LAB — CBC WITH DIFFERENTIAL/PLATELET
BASOS ABS: 0.1 10*3/uL (ref 0–0.1)
Basophils Relative: 1 %
EOS ABS: 0.6 10*3/uL (ref 0–0.7)
EOS PCT: 5 %
HCT: 38.8 % (ref 35.0–47.0)
Hemoglobin: 13.1 g/dL (ref 12.0–16.0)
LYMPHS ABS: 3.2 10*3/uL (ref 1.0–3.6)
Lymphocytes Relative: 28 %
MCH: 29.9 pg (ref 26.0–34.0)
MCHC: 33.6 g/dL (ref 32.0–36.0)
MCV: 89 fL (ref 80.0–100.0)
MONO ABS: 0.6 10*3/uL (ref 0.2–0.9)
MONOS PCT: 6 %
NEUTROS PCT: 60 %
Neutro Abs: 6.8 10*3/uL — ABNORMAL HIGH (ref 1.4–6.5)
PLATELETS: 364 10*3/uL (ref 150–440)
RBC: 4.36 MIL/uL (ref 3.80–5.20)
RDW: 11.9 % (ref 11.5–14.5)
WBC: 11.2 10*3/uL — ABNORMAL HIGH (ref 3.6–11.0)

## 2015-08-07 ENCOUNTER — Other Ambulatory Visit: Payer: Self-pay | Admitting: Orthopedic Surgery

## 2015-08-07 DIAGNOSIS — M654 Radial styloid tenosynovitis [de Quervain]: Secondary | ICD-10-CM

## 2015-08-07 DIAGNOSIS — M79645 Pain in left finger(s): Secondary | ICD-10-CM

## 2015-08-07 DIAGNOSIS — M25532 Pain in left wrist: Secondary | ICD-10-CM

## 2015-08-24 ENCOUNTER — Ambulatory Visit: Payer: Medicaid Other

## 2015-09-13 ENCOUNTER — Ambulatory Visit
Admission: RE | Admit: 2015-09-13 | Discharge: 2015-09-13 | Disposition: A | Discharge status: Home / Self Care | Payer: Medicaid Other | Source: Ambulatory Visit | Attending: Orthopedic Surgery | Admitting: Orthopedic Surgery

## 2015-09-13 DIAGNOSIS — M654 Radial styloid tenosynovitis [de Quervain]: Secondary | ICD-10-CM | POA: Diagnosis present

## 2015-09-13 DIAGNOSIS — M25532 Pain in left wrist: Secondary | ICD-10-CM | POA: Insufficient documentation

## 2015-09-13 DIAGNOSIS — R609 Edema, unspecified: Secondary | ICD-10-CM | POA: Diagnosis not present

## 2015-09-13 DIAGNOSIS — Z9889 Other specified postprocedural states: Secondary | ICD-10-CM | POA: Insufficient documentation

## 2015-09-13 DIAGNOSIS — M79645 Pain in left finger(s): Secondary | ICD-10-CM | POA: Diagnosis present

## 2015-11-17 ENCOUNTER — Emergency Department: Payer: Medicaid Other

## 2015-11-17 ENCOUNTER — Encounter: Payer: Self-pay | Admitting: Emergency Medicine

## 2015-11-17 ENCOUNTER — Emergency Department
Admission: EM | Admit: 2015-11-17 | Discharge: 2015-11-17 | Disposition: A | Discharge status: Home / Self Care | Payer: Medicaid Other | Attending: Emergency Medicine | Admitting: Emergency Medicine

## 2015-11-17 DIAGNOSIS — R51 Headache: Secondary | ICD-10-CM | POA: Diagnosis present

## 2015-11-17 DIAGNOSIS — I10 Essential (primary) hypertension: Secondary | ICD-10-CM | POA: Diagnosis not present

## 2015-11-17 DIAGNOSIS — R42 Dizziness and giddiness: Secondary | ICD-10-CM | POA: Insufficient documentation

## 2015-11-17 LAB — CBC WITH DIFFERENTIAL/PLATELET
BASOS ABS: 0.1 10*3/uL (ref 0–0.1)
Basophils Relative: 1 %
Eosinophils Absolute: 0.5 10*3/uL (ref 0–0.7)
HCT: 41.5 % (ref 35.0–47.0)
HEMOGLOBIN: 14.3 g/dL (ref 12.0–16.0)
LYMPHS ABS: 2 10*3/uL (ref 1.0–3.6)
MCH: 29.5 pg (ref 26.0–34.0)
MCHC: 34.5 g/dL (ref 32.0–36.0)
MCV: 85.7 fL (ref 80.0–100.0)
Monocytes Absolute: 0.6 10*3/uL (ref 0.2–0.9)
Monocytes Relative: 7 %
Neutro Abs: 5.1 10*3/uL (ref 1.4–6.5)
PLATELETS: 301 10*3/uL (ref 150–440)
RBC: 4.85 MIL/uL (ref 3.80–5.20)
RDW: 13.6 % (ref 11.5–14.5)
WBC: 8.3 10*3/uL (ref 3.6–11.0)

## 2015-11-17 LAB — URINALYSIS COMPLETE WITH MICROSCOPIC (ARMC ONLY)
BILIRUBIN URINE: NEGATIVE
Glucose, UA: NEGATIVE mg/dL
HGB URINE DIPSTICK: NEGATIVE
KETONES UR: NEGATIVE mg/dL
LEUKOCYTES UA: NEGATIVE
Nitrite: NEGATIVE
PH: 6 (ref 5.0–8.0)
PROTEIN: NEGATIVE mg/dL
SPECIFIC GRAVITY, URINE: 1.01 (ref 1.005–1.030)

## 2015-11-17 LAB — BASIC METABOLIC PANEL
ANION GAP: 10 (ref 5–15)
BUN: 15 mg/dL (ref 6–20)
CHLORIDE: 99 mmol/L — AB (ref 101–111)
CO2: 28 mmol/L (ref 22–32)
Calcium: 9.5 mg/dL (ref 8.9–10.3)
Creatinine, Ser: 0.81 mg/dL (ref 0.44–1.00)
GFR calc Af Amer: >60 mL/min (ref >60)
Glucose, Bld: 121 mg/dL — ABNORMAL HIGH (ref 65–99)
POTASSIUM: 3.5 mmol/L (ref 3.5–5.1)
SODIUM: 137 mmol/L (ref 135–145)

## 2015-11-17 LAB — TSH: TSH: 1.223 u[IU]/mL (ref 0.350–4.500)

## 2015-11-17 MED ORDER — ACETAMINOPHEN 325 MG PO TABS
650.0000 mg | ORAL_TABLET | Freq: Once | ORAL | Status: AC
  Administered 2015-11-17: 650 mg via ORAL
  Filled 2015-11-17: qty 2

## 2015-11-17 MED ORDER — SODIUM CHLORIDE 0.9 % IV SOLN
1000.0000 mL | Freq: Once | INTRAVENOUS | Status: AC
  Administered 2015-11-17: 1000 mL via INTRAVENOUS

## 2015-11-17 NOTE — ED Provider Notes (Signed)
CSN: [id number]     Arrival date & time 11/17/15  1206 History   First MD Initiated Contact with Patient 11/17/15 1332     Chief Complaint  Patient presents with  . Headache     HPI Comments: 45 year old female presents today complaining of headache and neck pain that started this morning. She has a history of chronic sinusitis and has completed several antibiotics over the past 6 months for the same. Was seen by her primary care [facility] today for her symptoms and was referred here to rule out meningitis. She has had subjective fevers and chills. Does have sensitivity to light and feels weak all over. Has felt much worse the past few days than she has the past 6 months. Has been taking aleve and motrin without relief of neck pain or headache.   The history is provided by the patient, medical records and a caregiver.    Past Medical History  Diagnosis Date  . Hypertension   . GERD (gastroesophageal reflux disease)    Past Surgical History  Procedure Laterality Date  . Tonsillectomy    . Cholecystectomy    . Abdominal hysterectomy    . Hand surgery     No family history on file. Social History  Substance Use Topics  . Smoking status: Never Smoker   . Smokeless tobacco: None  . Alcohol Use: Yes     Comment: socially   OB History    Gravida Para Term Preterm AB TAB SAB Ectopic Multiple Living   2         3     Review of Systems  Constitutional: Positive for fever and chills.  Eyes: Positive for photophobia.  Gastrointestinal: Positive for nausea. Negative for vomiting.  Musculoskeletal: Positive for myalgias, arthralgias, neck pain and neck stiffness.  Skin: Negative for rash.  Neurological: Positive for headaches. Negative for dizziness and light-headedness.  All other systems reviewed and are negative.     Allergies  Phenergan and Ultram  Home Medications   Prior to Admission medications   Medication Sig Start Date End Date Taking? Authorizing  Provider  doxycycline (VIBRAMYCIN) 100 MG capsule Take 1 capsule (100 mg total) by mouth 2 (two) times daily. 02/07/15   Jolene ProvostKirtida Patel, MD  ibuprofen (ADVIL,MOTRIN) 800 MG tablet Take 1 tablet (800 mg total) by mouth every 8 (eight) hours as needed for moderate pain. 01/13/15   Joni Reiningonald K Smith, PA-C  oxyCODONE-acetaminophen (PERCOCET) 7.5-325 MG per tablet Take 1 tablet by mouth every 4 (four) hours as needed for severe pain. 01/13/15 01/13/16  Joni Reiningonald K Smith, PA-C   BP 124/85 mmHg  Pulse 88  Temp(Src) 98.2 F (36.8 C) (Oral)  Resp 16  Ht 5\' 5"  (1.651 m)  Wt 77.565 kg  BMI 28.46 kg/m2  SpO2 99% Physical Exam  Constitutional: She is oriented to person, place, and time. Vital signs are normal. She appears well-developed and well-nourished. She is active.  Non-toxic appearance. She does not have a sickly appearance. She appears ill.  HENT:  Head: Normocephalic and atraumatic.  Right Ear: Tympanic membrane, external ear and ear canal normal.  Left Ear: Tympanic membrane, external ear and ear canal normal.  Nose: Nose normal. No mucosal edema or rhinorrhea.  Mouth/Throat: Uvula is midline, oropharynx is clear and moist and mucous membranes are normal.  Eyes: Conjunctivae and EOM are normal. Pupils are equal, round, and reactive to light.  +photophobia   Neck: Trachea normal and phonation normal. Spinous  process tenderness and muscular tenderness present.  Cardiovascular: Normal rate, regular rhythm, S1 normal, S2 normal, normal heart sounds, intact distal pulses and normal pulses.  Exam reveals no gallop and no friction rub.   No murmur heard. Pulmonary/Chest: Effort normal and breath sounds normal. No respiratory distress. She has no wheezes. She has no rales.  Musculoskeletal: Normal range of motion.  Neurological: She is alert and oriented to person, place, and time. No cranial nerve deficit. Coordination normal.  Skin: Skin is warm and dry.  Psychiatric: She has a normal mood and affect. Her  behavior is normal. Judgment and thought content normal.  Nursing note and vitals reviewed.   ED Course  Procedures (including critical care time) Labs Review Labs Reviewed  BASIC METABOLIC PANEL - Abnormal; Notable for the following:    Chloride 99 (*)    Glucose, Bld 121 (*)    All other components within normal limits  CBC WITH DIFFERENTIAL/PLATELET    Imaging Review No results found. I have personally reviewed and evaluated these images and lab results as part of my medical decision-making.   EKG Interpretation None      MDM  Reviewed medical records sent from [facility] office. Pt was sent to the ER strictly to rule out meningitis. She has history of chronic sinusitis but has not had any increased congestion today. Will hand over care to physician for further workup  Final diagnoses:  None        Christella Scheuermann, PA-C 11/17/15 1518  Jene Every, MD 11/17/15 [phone]

## 2015-11-17 NOTE — ED Provider Notes (Signed)
Patient presents with a multitude of complaints. She reports she has been treated for chronic sinus infections over the last several months. This has been unsuccessful. She reports over the last 3 weeks she has developed periods where she becomes hot and diaphoretic several times per day. She is not had fevers. She denies new medications. Today she has her usual frontal face maxillary sinus pain. She also complains of mild neck pain as well. However her symptoms have been occurring over the last several weeks. Does not seem consistent with meningitis to me. Her lab work is unremarkable thus far, she is afebrile, nontoxic appearing.  Exam does not demonstrate meningeal signs. Interestingly she did develop flushing and diaphoresis while I was in the room. Her heart rate did not change. This is her primary complaint. She denies chest pain when this happens. She denies a history of thyroid problems. No travel.  ----------------------------------------- 7:34 PM on 11/17/2015 -----------------------------------------  Patient with multiple episodes of flushing and diaphoresis. Temperature has been checked multiple times and has never been abnormal. Patient reports headache has improved significantly without treatment. CT head is unremarkable, no evidence of sinus disease. Given course of several weeks this is not consistent with meningitis.    Consult to Dr. Judithann SheenSparks of internal medicine who saw the patient and agrees this is not consistent with meningitis. He feels she does not require admission and He recommends outpatient follow-up with ID for multiple chronic complaints. I discussed this with the patient and she feels this is reasonable. She is to return to the hospital she feels worse or develops new symptoms  Jene Everyobert Dariel Betzer, MD 11/17/15 2242

## 2015-11-17 NOTE — ED Notes (Signed)
Seen by Dr. Gerilyn PilgrimSykes at [facility] this morning for follow up on sinus infection. Sinus infection diagnosed 6 months ago and has been taking antibiotics for the past 6 months.  Symptoms worsening.  C/O chills, sweats, numbness to hands and feet.  extremity swelling.  C/o pain to back of neck

## 2015-11-17 NOTE — ED Notes (Signed)
Reports sinus infection for 6 months.  Today having chills and generalized weakness. MAE, ambulates without difficulty, PERRL.

## 2015-11-17 NOTE — Discharge Instructions (Signed)

## 2015-11-17 NOTE — ED Notes (Signed)
Patient transported to CT 

## 2015-11-18 ENCOUNTER — Emergency Department
Admission: EM | Admit: 2015-11-18 | Discharge: 2015-11-18 | Disposition: A | Discharge status: Home / Self Care | Payer: Medicaid Other | Attending: Emergency Medicine | Admitting: Emergency Medicine

## 2015-11-18 DIAGNOSIS — R519 Headache, unspecified: Secondary | ICD-10-CM

## 2015-11-18 DIAGNOSIS — R51 Headache: Secondary | ICD-10-CM

## 2015-11-18 DIAGNOSIS — I10 Essential (primary) hypertension: Secondary | ICD-10-CM | POA: Insufficient documentation

## 2015-11-18 DIAGNOSIS — R55 Syncope and collapse: Secondary | ICD-10-CM | POA: Diagnosis not present

## 2015-11-18 LAB — CBC WITH DIFFERENTIAL/PLATELET
BASOS PCT: 1 %
Basophils Absolute: 0 10*3/uL (ref 0–0.1)
EOS ABS: 0.2 10*3/uL (ref 0–0.7)
EOS PCT: 3 %
HCT: 40 % (ref 35.0–47.0)
HEMOGLOBIN: 14 g/dL (ref 12.0–16.0)
Lymphocytes Relative: 26 %
Lymphs Abs: 1.7 10*3/uL (ref 1.0–3.6)
MCH: 29.8 pg (ref 26.0–34.0)
MCHC: 35.1 g/dL (ref 32.0–36.0)
MCV: 85.1 fL (ref 80.0–100.0)
Monocytes Absolute: 0.5 10*3/uL (ref 0.2–0.9)
Monocytes Relative: 8 %
NEUTROS PCT: 62 %
Neutro Abs: 4.1 10*3/uL (ref 1.4–6.5)
PLATELETS: 292 10*3/uL (ref 150–440)
RBC: 4.7 MIL/uL (ref 3.80–5.20)
RDW: 13.5 % (ref 11.5–14.5)
WBC: 6.4 10*3/uL (ref 3.6–11.0)

## 2015-11-18 LAB — BASIC METABOLIC PANEL
ANION GAP: 8 (ref 5–15)
BUN: 11 mg/dL (ref 6–20)
CALCIUM: 9.6 mg/dL (ref 8.9–10.3)
CO2: 26 mmol/L (ref 22–32)
Chloride: 106 mmol/L (ref 101–111)
Creatinine, Ser: 0.81 mg/dL (ref 0.44–1.00)
GFR calc Af Amer: >60 mL/min (ref >60)
GFR calc non Af Amer: >60 mL/min (ref >60)
GLUCOSE: 99 mg/dL (ref 65–99)
POTASSIUM: 2.9 mmol/L — AB (ref 3.5–5.1)
Sodium: 140 mmol/L (ref 135–145)

## 2015-11-18 LAB — GLUCOSE, CAPILLARY: GLUCOSE-CAPILLARY: 120 mg/dL — AB (ref 65–99)

## 2015-11-18 LAB — TROPONIN I
Troponin I: <0.03 ng/mL (ref <0.031)
Troponin I: <0.03 ng/mL (ref <0.031)

## 2015-11-18 MED ORDER — POTASSIUM CHLORIDE CRYS ER 20 MEQ PO TBCR
40.0000 meq | EXTENDED_RELEASE_TABLET | Freq: Once | ORAL | Status: AC
  Administered 2015-11-18: 40 meq via ORAL
  Filled 2015-11-18: qty 2

## 2015-11-18 MED ORDER — PROCHLORPERAZINE EDISYLATE 5 MG/ML IJ SOLN
10.0000 mg | Freq: Once | INTRAMUSCULAR | Status: AC
  Administered 2015-11-18: 10 mg via INTRAVENOUS

## 2015-11-18 MED ORDER — PROCHLORPERAZINE MALEATE 10 MG PO TABS
10.0000 mg | ORAL_TABLET | Freq: Once | ORAL | Status: DC
  Filled 2015-11-18: qty 1

## 2015-11-18 MED ORDER — PROCHLORPERAZINE EDISYLATE 5 MG/ML IJ SOLN
INTRAMUSCULAR | Status: AC
  Administered 2015-11-18: 10 mg via INTRAVENOUS
  Filled 2015-11-18: qty 2

## 2015-11-18 NOTE — ED Provider Notes (Signed)
[facility] Emergency Department Provider Note    ____________________________________________  Time seen: ~1150  I have reviewed the triage vital signs and the nursing notes.   HISTORY  Chief Complaint Loss of Consciousness   History limited by: Not Limited   HPI Cynthia Kramer is a 45 y.o. female who presents to the emergency department today with primary complaint of passing out. The patient states that this happened this morning. She states she was just getting out of the shower. She states she started feeling lightheaded. She went out her in years. Family then states that she went in and out of consciousness for roughly 10 minutes. They thought they were seen some jerking movement. The patient then also has multiple other complaints including chronic issues for which she was seen yesterday in the emergency department. Patient states that she has been getting sicker. She states she is having problems with constipation, sinusitis. She has been on multiple antibiotics. She describes episodes of being hot and flushed with sweating.   Past Medical History  Diagnosis Date  . Hypertension   . GERD (gastroesophageal reflux disease)     There are no active problems to display for this patient.   Past Surgical History  Procedure Laterality Date  . Tonsillectomy    . Cholecystectomy    . Abdominal hysterectomy    . Hand surgery      Current Outpatient Rx  Name  Route  Sig  Dispense  Refill  . doxycycline (VIBRAMYCIN) 100 MG capsule   Oral   Take 1 capsule (100 mg total) by mouth 2 (two) times daily.   20 capsule   0   . ibuprofen (ADVIL,MOTRIN) 800 MG tablet   Oral   Take 1 tablet (800 mg total) by mouth every 8 (eight) hours as needed for moderate pain.   15 tablet   0   . oxyCODONE-acetaminophen (PERCOCET) 7.5-325 MG per tablet   Oral   Take 1 tablet by mouth every 4 (four) hours as needed for severe pain.   20 tablet   0      Allergies Phenergan and Ultram  No family history on file.  Social History Social History  Substance Use Topics  . Smoking status: Never Smoker   . Smokeless tobacco: Not on file  . Alcohol Use: Yes     Comment: socially    Review of Systems  Constitutional: Negative for fever. Cardiovascular: Negative for chest pain. Respiratory: Negative for shortness of breath. Gastrointestinal: Negative for abdominal pain, vomiting and diarrhea. Neurological: Negative for headaches, focal weakness or numbness.   10-point ROS otherwise negative.  ____________________________________________   PHYSICAL EXAM:  VITAL SIGNS: ED Triage Vitals  Enc Vitals Group     BP 11/18/15 1116 159/94 mmHg     Pulse Rate 11/18/15 1116 105     Resp 11/18/15 1116 20     Temp 11/18/15 1116 98.1 F (36.7 C)     Temp Source 11/18/15 1116 Oral     SpO2 11/18/15 1116 100 %     Weight 11/18/15 1116 158 lb (71.668 kg)     Height 11/18/15 1116 _0  (1.651 m)     Head Cir --      Peak Flow --      Pain Score 11/18/15 1117 8   Constitutional: Alert and oriented. Slightly anxious. Eyes: Conjunctivae are normal. PERRL. Normal extraocular movements. ENT   Head: Normocephalic and atraumatic.   Nose: No congestion/rhinnorhea.   Mouth/Throat: Mucous  membranes are moist.   Neck: No stridor. Hematological/Lymphatic/Immunilogical: No cervical lymphadenopathy. Cardiovascular: Normal rate, regular rhythm.  No murmurs, rubs, or gallops. Respiratory: Normal respiratory effort without tachypnea nor retractions. Breath sounds are clear and equal bilaterally. No wheezes/rales/rhonchi. Gastrointestinal: Soft and nontender. No distention. There is no CVA tenderness. Genitourinary: Deferred Musculoskeletal: Normal range of motion in all extremities. No joint effusions.  No lower extremity tenderness nor edema. Neurologic:  Normal speech and language. No gross focal neurologic deficits are appreciated.   Skin:  Skin is warm, dry and intact. No rash noted. Psychiatric: Mood and affect are normal. Speech and behavior are normal. Patient exhibits appropriate insight and judgment.  ____________________________________________    LABS (pertinent positives/negatives)  Labs Reviewed  GLUCOSE, CAPILLARY - Abnormal; Notable for the following:    Glucose-Capillary 120 (*)    All other components within normal limits  BASIC METABOLIC PANEL - Abnormal; Notable for the following:    Potassium 2.9 (*)    All other components within normal limits  CBC WITH DIFFERENTIAL/PLATELET  TROPONIN I  TROPONIN I     ____________________________________________   EKG  I, Nance Pear, attending physician, personally viewed and interpreted this EKG  EKG Time: 1118 Rate: 105 Rhythm: sinus tachycardia Axis: normal Intervals: qtc 446 QRS: narrow ST changes: no st elevation Impression: abnormal ekg   ____________________________________________    RADIOLOGY  None  ____________________________________________   PROCEDURES  Procedure(s) performed: None  Critical Care performed: No  ____________________________________________   INITIAL IMPRESSION / ASSESSMENT AND PLAN / ED COURSE  Pertinent labs & imaging results that were available during my care of the patient were reviewed by me and considered in my medical decision making (see chart for details).  Patient presents to emergency department today with primary complaint of blacking out however has multiple medical complaints on top of this. She was seen in the emergency department yesterday to rule out meningitis. She was discharged after physical exam was not concerning for meningitis. My exam today patient appears slightly anxious. Lungs and heart sound well. No focal neuro deficits. This point I did discuss with patient about workup of chronic medical problems. She does state the most recent was begun on for 6 months. I discussed  with patient I would be unlikely to find the answer to this chronic problems. We will work her up for her episode of blacking out today.  ----------------------------------------- 3:35 PM on 11/18/2015 -----------------------------------------  Blood work here without any concerning findings save for a low potassium. This was repleted. I did want to check a second troponin however a second troponin was drawn earlier than I would've liked. I ordered a third troponin however patient chose to leave the emergency department prior to the third troponin being drawn and resulting. I think this is reasonable given 2 negative troponins and clinical story. At this point patient does have myriad of medical place. Did discuss with the patient reports following up with primary care.  ____________________________________________   FINAL CLINICAL IMPRESSION(S) / ED DIAGNOSES  Final diagnoses:  Syncope, unspecified syncope type  Headache, unspecified headache type     Nance Pear, MD 11/18/15 1536

## 2015-11-18 NOTE — Discharge Instructions (Signed)
Please seek medical attention for any high fevers, chest pain, shortness of breath, change in behavior, persistent vomiting, bloody stool or any other new or concerning symptoms. ° ° °Syncope °Syncope means a person passes out (faints). The person usually wakes up in less than 5 minutes. It is important to seek medical care for syncope. °HOME CARE °· Have someone stay with you until you feel normal. °· Do not drive, use machines, or play sports until your doctor says it is okay. °· Keep all doctor visits as told. °· Lie down when you feel like you might pass out. Take deep breaths. Wait until you feel normal before standing up. °· Drink enough fluids to keep your pee (urine) clear or pale yellow. °· If you take blood pressure or heart medicine, get up slowly. Take several minutes to sit and then stand. °GET HELP RIGHT AWAY IF:  °· You have a severe headache. °· You have pain in the chest, belly (abdomen), or back. °· You are bleeding from the mouth or butt (rectum). °· You have black or tarry poop (stool). °· You have an irregular or very fast heartbeat. °· You have pain with breathing. °· You keep passing out, or you have shaking (seizures) when you pass out. °· You pass out when sitting or lying down. °· You feel confused. °· You have trouble walking. °· You have severe weakness. °· You have vision problems. °If you fainted, call for help (911 in [place] Do not drive yourself to the hospital. °  °This information is not intended to replace advice given to you by your health care provider. Make sure you discuss any questions you have with your health care provider. °  °Document Released: 12/10/2007 Document Revised: 11/07/2014 Document Reviewed: 08/22/2011 °Elsevier Interactive Patient Education ©2016 [employer] ° °

## 2015-11-18 NOTE — ED Notes (Addendum)
Pt states she was here yesterday and was suppose to be admitted for infection was referred to infectious disease. States she "blacked out" states she was told she was in and out for 10 minutes by family today. States she fell to her knees. Reports sweats and chills

## 2015-11-18 NOTE — ED Notes (Signed)
Critical potassium MD made aware

## 2015-11-30 ENCOUNTER — Encounter: Payer: Self-pay | Admitting: Emergency Medicine

## 2015-11-30 ENCOUNTER — Emergency Department
Admission: EM | Admit: 2015-11-30 | Discharge: 2015-11-30 | Disposition: A | Discharge status: Home / Self Care | Payer: Medicaid Other | Attending: Student | Admitting: Student

## 2015-11-30 DIAGNOSIS — R21 Rash and other nonspecific skin eruption: Secondary | ICD-10-CM

## 2015-11-30 DIAGNOSIS — I10 Essential (primary) hypertension: Secondary | ICD-10-CM | POA: Insufficient documentation

## 2015-11-30 DIAGNOSIS — L0100 Impetigo, unspecified: Secondary | ICD-10-CM | POA: Insufficient documentation

## 2015-11-30 LAB — COMPREHENSIVE METABOLIC PANEL
ALK PHOS: 83 U/L (ref 38–126)
ALT: 32 U/L (ref 14–54)
ANION GAP: 7 (ref 5–15)
AST: 30 U/L (ref 15–41)
Albumin: 4 g/dL (ref 3.5–5.0)
BUN: 10 mg/dL (ref 6–20)
CALCIUM: 9.3 mg/dL (ref 8.9–10.3)
CO2: 27 mmol/L (ref 22–32)
Chloride: 105 mmol/L (ref 101–111)
Creatinine, Ser: 0.95 mg/dL (ref 0.44–1.00)
GFR calc non Af Amer: >60 mL/min (ref >60)
Glucose, Bld: 105 mg/dL — ABNORMAL HIGH (ref 65–99)
POTASSIUM: 3 mmol/L — AB (ref 3.5–5.1)
SODIUM: 139 mmol/L (ref 135–145)
TOTAL PROTEIN: 6.8 g/dL (ref 6.5–8.1)
Total Bilirubin: 0.7 mg/dL (ref 0.3–1.2)

## 2015-11-30 LAB — URINALYSIS COMPLETE WITH MICROSCOPIC (ARMC ONLY)
Bilirubin Urine: NEGATIVE
Glucose, UA: NEGATIVE mg/dL
HGB URINE DIPSTICK: NEGATIVE
Ketones, ur: NEGATIVE mg/dL
Leukocytes, UA: NEGATIVE
NITRITE: NEGATIVE
PH: 6 (ref 5.0–8.0)
PROTEIN: NEGATIVE mg/dL
SPECIFIC GRAVITY, URINE: 1.015 (ref 1.005–1.030)

## 2015-11-30 LAB — CBC
HCT: 39.1 % (ref 35.0–47.0)
HEMOGLOBIN: 13.1 g/dL (ref 12.0–16.0)
MCH: 28.5 pg (ref 26.0–34.0)
MCHC: 33.4 g/dL (ref 32.0–36.0)
MCV: 85.3 fL (ref 80.0–100.0)
Platelets: 252 10*3/uL (ref 150–440)
RBC: 4.59 MIL/uL (ref 3.80–5.20)
RDW: 13.5 % (ref 11.5–14.5)
WBC: 7.4 10*3/uL (ref 3.6–11.0)

## 2015-11-30 LAB — LIPASE, BLOOD: Lipase: 23 U/L (ref 11–51)

## 2015-11-30 MED ORDER — MUPIROCIN 2 % EX OINT: TOPICAL_OINTMENT | CUTANEOUS | Status: AC

## 2015-11-30 MED ORDER — OXYCODONE HCL 5 MG PO TABS
10.0000 mg | ORAL_TABLET | Freq: Once | ORAL | Status: AC
  Administered 2015-11-30: 10 mg via ORAL
  Filled 2015-11-30: qty 2

## 2015-11-30 MED ORDER — OXYCODONE HCL 5 MG PO TABS: 5.0000 mg | ORAL_TABLET | Freq: Four times a day (QID) | ORAL | Status: DC | PRN

## 2015-11-30 MED ORDER — FLUOCINONIDE 0.05 % EX OINT: 1.0000 "application " | TOPICAL_OINTMENT | Freq: Four times a day (QID) | CUTANEOUS | Status: DC

## 2015-11-30 NOTE — ED Notes (Signed)
Patient presents to the ED with "sores" on her body and face.  Patient states, "It's like my skin just started breaking open."  Patient reports large sore on leg and stomach."  Patient states areas are painful.  Patient is alert and oriented, no difficulty breathing.  Patient reports vomiting x 1 and diarrhea x 1 in the past 24 hours.

## 2015-11-30 NOTE — ED Provider Notes (Signed)
[facility] Emergency Department Provider Note   ____________________________________________  Time seen: Approximately 3:10 PM  I have reviewed the triage vital signs and the nursing notes.   HISTORY  Chief Complaint Rash    HPI Cynthia Kramer is a 45 y.o. female with hypertension and GERD who presents for evaluation of painful rash over the past 2-3 days, gradual onset, constant, no modifying factors. She denies fevers. No chest pain or difficulty breathing. She had one episode of nonbloody nonbilious emesis and one episode of diarrhea over the past 24-48 hours however this is not uncommon for her. She has been seen in this emergency department twice this month for evaluation of  chronic complaints ongoing for several months  including chronic sinusitis for which she has been treated with 6 different antibiotics, as well as constipation, flushing and one episode of syncope. She reports that she is following up with a primary care doctor about those but she has yet to receive approval through her insurance so that [facility] can [facility].   Past Medical History  Diagnosis Date  . Hypertension   . GERD (gastroesophageal reflux disease)     There are no active problems to display for this patient.   Past Surgical History  Procedure Laterality Date  . Tonsillectomy    . Cholecystectomy    . Abdominal hysterectomy    . Hand surgery      Current Outpatient Rx  Name  Route  Sig  Dispense  Refill  . doxycycline (VIBRAMYCIN) 100 MG capsule   Oral   Take 1 capsule (100 mg total) by mouth 2 (two) times daily.   20 capsule   0   . fluocinonide ointment (LIDEX) 0.05 %   Topical   Apply 1 application topically 4 (four) times daily. Apply to the affected area on the inside of the mouth/lip after meals and before bed daily   30 g   0   . mupirocin ointment (BACTROBAN) 2 %      Apply to affected area 3 times daily   22 g   0   . oxyCODONE  (ROXICODONE) 5 MG immediate release tablet   Oral   Take 1 tablet (5 mg total) by mouth every 6 (six) hours as needed for breakthrough pain. Do not drive while taking this medication.   8 tablet   0   . oxyCODONE-acetaminophen (PERCOCET) 7.5-325 MG per tablet   Oral   Take 1 tablet by mouth every 4 (four) hours as needed for severe pain.   20 tablet   0     Allergies Phenergan and Ultram  No family history on file.  Social History Social History  Substance Use Topics  . Smoking status: Never Smoker   . Smokeless tobacco: None  . Alcohol Use: Yes     Comment: socially    Review of Systems Constitutional: No fevers. Eyes: No visual changes. ENT: No sore throat. Cardiovascular: Denies chest pain. Respiratory: Denies shortness of breath. Gastrointestinal: No abdominal pain.  No nausea, +vomiting.  +diarrhea.  No constipation. Genitourinary: Negative for dysuria. Musculoskeletal: Negative for back pain. Skin: Positive for rash. Neurological: Negative for headaches, focal weakness or numbness.  10-point ROS otherwise negative.  ____________________________________________   PHYSICAL EXAM:  VITAL SIGNS: ED Triage Vitals  Enc Vitals Group     BP 11/30/15 1315 160/110 mmHg     Pulse Rate 11/30/15 1315 92     Resp 11/30/15 1315 18  Temp 11/30/15 1315 98.3 F (36.8 C)     Temp Source 11/30/15 1315 Oral     SpO2 11/30/15 1315 100 %     Weight 11/30/15 1315 156 lb (70.761 kg)     Height 11/30/15 1315  (1.651 m)     Head Cir --      Peak Flow --      Pain Score 11/30/15 1316 9     Pain Loc --      Pain Edu? --      Excl. in GC? --     Constitutional: Alert and oriented. Well appearing and in no acute distress. Eyes: Conjunctivae are normal. PERRL. EOMI. Head: Atraumatic. Nose: No congestion/rhinnorhea. Mouth/Throat: Mucous membranes are moist.  Oropharynx non-erythematous. On the inner mucosa of the lower lip, there are small white lacy patches, Neck:  No stridor. Supple without meningismus. Cardiovascular: Normal rate, regular rhythm. Grossly normal heart sounds.  Good peripheral circulation. Respiratory: Normal respiratory effort.  No retractions. Lungs CTAB. Gastrointestinal: Soft and nontender. No distention.  No CVA tenderness. Genitourinary: deferred Musculoskeletal: No lower extremity tenderness nor edema.  No joint effusions. Neurologic:  Normal speech and language. No gross focal neurologic deficits are appreciated. No gait instability. Skin:  There are discrete erythematous macules with golden crusting isolated to the chin and one on the right forehead, 3 small similar lesions measuring less than 1 cm are located on the abdomen, and one similar lesion is located on the right lower leg. No involvement of the palms. No petechiae. Psychiatric: Mood and affect are normal. Speech and behavior are normal.  ____________________________________________   LABS (all labs ordered are listed, but only abnormal results are displayed)  Labs Reviewed  COMPREHENSIVE METABOLIC PANEL - Abnormal; Notable for the following:    Potassium 3.0 (*)    Glucose, Bld 105 (*)    All other components within normal limits  URINALYSIS COMPLETEWITH MICROSCOPIC ([facility] ONLY) - Abnormal; Notable for the following:    Color, Urine YELLOW (*)    APPearance CLEAR (*)    Bacteria, UA RARE (*)    Squamous Epithelial / LPF 0-5 (*)    All other components within normal limits  LIPASE, BLOOD  CBC   ____________________________________________  EKG  none ____________________________________________  RADIOLOGY  none ____________________________________________   PROCEDURES  Procedure(s) performed: None  Critical Care performed: No  ____________________________________________   INITIAL IMPRESSION / ASSESSMENT AND PLAN / ED COURSE  Pertinent labs & imaging results that were available during my care of the patient were reviewed by me and considered  in my medical decision making (see chart for details).  TRIVIA Cynthia Kramer is a 45 y.o. female with hypertension and GERD who presents for evaluation of painful rash over the past 2-3 days, gradual onset, constant, no modifying factors. On exam, she is well-appearing and in no acute distress. Her vital signs are stable, she is afebrile. She has what appears to be discrete areas of nonbullous impetigo without abscess isolated to the face, abdomen and the right leg. She is afebrile. I reviewed her labs, CBC is unremarkable, no leukocytosis. CMP with mild hypokalemia, normal lipase, negative urinalysis. With the patient's permission, I texted de-identified pictures of the lesions to the dermatologist on call, Dr. Lucy Antigua and she has reviewed the images and we discussed the patient's case. She reports, and I agree, that these discrete lesions are consistent with impetigo and she recommends mupirocin topically. Additionally we discussed the small lacy patches on the inside  of the lower lip and she reports that thy are consistent with possibly lichen planus versus aphthous stomatitis, she recommends fluocinonide. She agrees that given the patient's discrete/isolated lesions without bullae, confluence or scaling, and general well-appearence, no fever, reassuring labs, that this does not represent Stevens-Johnson syndrome or toxic epidermal necrolysis. She is happy to see the patient in clinic for follow-up next week. I discussed this with the patient, as well as meticulous return precautions and she is comfortable with the discharge plan. DC home. ____________________________________________   FINAL CLINICAL IMPRESSION(S) / ED DIAGNOSES  Final diagnoses:  Rash  Impetigo      NEW MEDICATIONS STARTED DURING THIS VISIT:  New Prescriptions   FLUOCINONIDE OINTMENT (LIDEX) 0.05 %    Apply 1 application topically 4 (four) times daily. Apply to the affected area on the inside of the mouth/lip after meals and  before bed daily   MUPIROCIN OINTMENT (BACTROBAN) 2 %    Apply to affected area 3 times daily   OXYCODONE (ROXICODONE) 5 MG IMMEDIATE RELEASE TABLET    Take 1 tablet (5 mg total) by mouth every 6 (six) hours as needed for breakthrough pain. Do not drive while taking this medication.     Note:  This document was prepared using Dragon voice recognition software and may include unintentional dictation errors.    Gayla Doss, MD 11/30/15 [phone]

## 2016-11-15 ENCOUNTER — Emergency Department
Admission: EM | Admit: 2016-11-15 | Discharge: 2016-11-15 | Disposition: A | Discharge status: Home / Self Care | Payer: Medicaid Other | Attending: Emergency Medicine | Admitting: Emergency Medicine

## 2016-11-15 ENCOUNTER — Encounter: Payer: Self-pay | Admitting: Emergency Medicine

## 2016-11-15 DIAGNOSIS — I10 Essential (primary) hypertension: Secondary | ICD-10-CM | POA: Insufficient documentation

## 2016-11-15 DIAGNOSIS — F17200 Nicotine dependence, unspecified, uncomplicated: Secondary | ICD-10-CM | POA: Diagnosis not present

## 2016-11-15 DIAGNOSIS — Z79899 Other long term (current) drug therapy: Secondary | ICD-10-CM | POA: Insufficient documentation

## 2016-11-15 DIAGNOSIS — R112 Nausea with vomiting, unspecified: Secondary | ICD-10-CM | POA: Insufficient documentation

## 2016-11-15 LAB — COMPREHENSIVE METABOLIC PANEL
ALT: 23 U/L (ref 14–54)
AST: 26 U/L (ref 15–41)
Albumin: 4.8 g/dL (ref 3.5–5.0)
Alkaline Phosphatase: 95 U/L (ref 38–126)
Anion gap: 12 (ref 5–15)
BUN: 23 mg/dL — AB (ref 6–20)
CHLORIDE: 96 mmol/L — AB (ref 101–111)
CO2: 28 mmol/L (ref 22–32)
Calcium: 10 mg/dL (ref 8.9–10.3)
Creatinine, Ser: 1.07 mg/dL — ABNORMAL HIGH (ref 0.44–1.00)
Glucose, Bld: 114 mg/dL — ABNORMAL HIGH (ref 65–99)
POTASSIUM: 2.9 mmol/L — AB (ref 3.5–5.1)
Sodium: 136 mmol/L (ref 135–145)
Total Bilirubin: 1.1 mg/dL (ref 0.3–1.2)
Total Protein: 8.5 g/dL — ABNORMAL HIGH (ref 6.5–8.1)

## 2016-11-15 LAB — CBC
HEMATOCRIT: 47.4 % — AB (ref 35.0–47.0)
HEMOGLOBIN: 16.4 g/dL — AB (ref 12.0–16.0)
MCH: 29.2 pg (ref 26.0–34.0)
MCHC: 34.5 g/dL (ref 32.0–36.0)
MCV: 84.6 fL (ref 80.0–100.0)
Platelets: 401 10*3/uL (ref 150–440)
RBC: 5.61 MIL/uL — AB (ref 3.80–5.20)
RDW: 11.9 % (ref 11.5–14.5)
WBC: 10.2 10*3/uL (ref 3.6–11.0)

## 2016-11-15 LAB — URINALYSIS, COMPLETE (UACMP) WITH MICROSCOPIC
BACTERIA UA: NONE SEEN
BILIRUBIN URINE: NEGATIVE
Glucose, UA: NEGATIVE mg/dL
HGB URINE DIPSTICK: NEGATIVE
Ketones, ur: NEGATIVE mg/dL
LEUKOCYTES UA: NEGATIVE
Nitrite: NEGATIVE
PROTEIN: 30 mg/dL — AB
Specific Gravity, Urine: 1.023 (ref 1.005–1.030)
pH: 6 (ref 5.0–8.0)

## 2016-11-15 LAB — LIPASE, BLOOD: Lipase: 27 U/L (ref 11–51)

## 2016-11-15 LAB — TYPE AND SCREEN
ABO/RH(D): A NEG
Antibody Screen: NEGATIVE

## 2016-11-15 MED ORDER — ONDANSETRON HCL 4 MG/2ML IJ SOLN
4.0000 mg | Freq: Once | INTRAMUSCULAR | Status: AC | PRN
  Administered 2016-11-15: 4 mg via INTRAVENOUS
  Filled 2016-11-15: qty 2

## 2016-11-15 MED ORDER — ACETAMINOPHEN 325 MG PO TABS
650.0000 mg | ORAL_TABLET | Freq: Once | ORAL | Status: AC
  Administered 2016-11-15: 650 mg via ORAL
  Filled 2016-11-15: qty 2

## 2016-11-15 MED ORDER — ONDANSETRON 4 MG PO TBDP: 4.0000 mg | ORAL_TABLET | Freq: Three times a day (TID) | ORAL | 0 refills | Status: DC | PRN

## 2016-11-15 MED ORDER — ONDANSETRON HCL 4 MG/2ML IJ SOLN
4.0000 mg | Freq: Once | INTRAMUSCULAR | Status: AC
  Administered 2016-11-15: 4 mg via INTRAVENOUS
  Filled 2016-11-15: qty 2

## 2016-11-15 MED ORDER — SODIUM CHLORIDE 0.9 % IV SOLN
1000.0000 mL | Freq: Once | INTRAVENOUS | Status: AC
  Administered 2016-11-15: 1000 mL via INTRAVENOUS

## 2016-11-15 NOTE — ED Triage Notes (Signed)
Patient presents to the ED with vomiting x 3 weeks, but unable to keep any fluids or food down since Thursday.  Patient reports vomiting greater than 10 times today.  Patient vomiting multiple times in triage.  Patient reports emesis is yellow currently but was dark brown coffee grounds before.  Patient is complaining of headache and abdominal pain.  Patient states she has been sick for the past year and she has seen many doctors and they don't know what is wrong.  Patient reports sores on her hands and other symptoms.

## 2016-11-15 NOTE — ED Notes (Signed)
Pt verbalized understanding of discharge instructions. NAD at this time. 

## 2016-11-15 NOTE — ED Notes (Signed)
Pt brought back from waiting room. Here for vomiting x 3 weeks.

## 2016-11-15 NOTE — ED Provider Notes (Signed)
[facility] Emergency Department Provider Note   ____________________________________________    I have reviewed the triage vital signs and the nursing notes.   HISTORY  Chief Complaint Nausea and vomiting    HPI Cynthia Kramer is a 46 y.o. female who presents with complaints of nausea and vomiting. She reports she has had episodes of nausea May times over the last 3-4 weeks however over the last 2 days she has had repeated episodes of vomiting and she feels quite dehydrated and dizzy. She denies diarrhea. She reports primarily her vomitus was yellow but did have a streak of blood in it earlier today, she has vomited since then with no blood. Denies fevers or chills. No recent travel. Denies abdominal pain to me. Nothing has helped her symptoms, eating makes them worse   Past Medical History:  Diagnosis Date  . GERD (gastroesophageal reflux disease)   . Hypertension     There are no active problems to display for this patient.   Past Surgical History:  Procedure Laterality Date  . ABDOMINAL HYSTERECTOMY    . CHOLECYSTECTOMY    . HAND SURGERY    . TONSILLECTOMY      Prior to Admission medications   Medication Sig Start Date End Date Taking? Authorizing Provider  doxycycline (VIBRAMYCIN) 100 MG capsule Take 1 capsule (100 mg total) by mouth 2 (two) times daily. 02/07/15   Jolene Provost, MD  fluocinonide ointment (LIDEX) 0.05 % Apply 1 application topically 4 (four) times daily. Apply to the affected area on the inside of the mouth/lip after meals and before bed daily 11/30/15   Gayla Doss, MD  mupirocin ointment (BACTROBAN) 2 % Apply to affected area 3 times daily 11/30/15 11/29/16  Gayla Doss, MD  ondansetron (ZOFRAN ODT) 4 MG disintegrating tablet Take 1 tablet (4 mg total) by mouth every 8 (eight) hours as needed for nausea or vomiting. 11/15/16   Jene Every, MD  oxyCODONE (ROXICODONE) 5 MG immediate release tablet Take 1 tablet (5 mg  total) by mouth every 6 (six) hours as needed for breakthrough pain. Do not drive while taking this medication. 11/30/15   Gayla Doss, MD     Allergies Phenergan [promethazine hcl] and Ultram [tramadol]  No family history on file.  Social History Social History  Substance Use Topics  . Smoking status: Current Some Day Smoker  . Smokeless tobacco: Never Used  . Alcohol use No    Review of Systems  Constitutional: No fever/chills Eyes: No visual changes.  ENT: No sore throat. Cardiovascular: Denies chest pain. Respiratory: Denies shortness of breath. Gastrointestinal: No abdominal pain. Nausea and vomiting as above Genitourinary: Negative for dysuria. Musculoskeletal: Chronic back pain Skin: Negative for rash. Neurological: Negative for headaches   ____________________________________________   PHYSICAL EXAM:  VITAL SIGNS: ED Triage Vitals  Enc Vitals Group     BP 11/15/16 1255 (!) 140/96     Pulse Rate 11/15/16 1255 (!) 103     Resp 11/15/16 1255 18     Temp 11/15/16 1255 98.7 F (37.1 C)     Temp Source 11/15/16 1255 Oral     SpO2 11/15/16 1255 99 %     Weight 11/15/16 1256 152 lb (68.9 kg)     Height 11/15/16 1256 5\' 5"  (1.651 m)     Head Circumference --      Peak Flow --      Pain Score 11/15/16 1300 9     Pain Loc --  Pain Edu? --      Excl. in GC? --     Constitutional: Alert and oriented. No acute distress.  Eyes: Conjunctivae are normal.  Head: Atraumatic. Nose: No congestion/rhinnorhea. Mouth/Throat: Mucous membranes are moist.    Cardiovascular: Normal rate, regular rhythm. Grossly normal heart sounds.  Good peripheral circulation. Respiratory: Normal respiratory effort.  No retractions. Lungs CTAB. Gastrointestinal: Soft and nontender. No distention.  No CVA tenderness. Genitourinary: deferred Musculoskeletal: No lower extremity tenderness nor edema.  Warm and well perfused Neurologic:  Normal speech and language. No gross focal  neurologic deficits are appreciated.  Skin:  Skin is warm, dry and intact. No rash noted. Psychiatric: Mood and affect are normal. Speech and behavior are normal.  ____________________________________________   LABS (all labs ordered are listed, but only abnormal results are displayed)  Labs Reviewed  COMPREHENSIVE METABOLIC PANEL - Abnormal; Notable for the following:       Result Value   Potassium 2.9 (*)    Chloride 96 (*)    Glucose, Bld 114 (*)    BUN 23 (*)    Creatinine, Ser 1.07 (*)    Total Protein 8.5 (*)    All other components within normal limits  CBC - Abnormal; Notable for the following:    RBC 5.61 (*)    Hemoglobin 16.4 (*)    HCT 47.4 (*)    All other components within normal limits  URINALYSIS, COMPLETE (UACMP) WITH MICROSCOPIC - Abnormal; Notable for the following:    Color, Urine YELLOW (*)    APPearance HAZY (*)    Protein, ur 30 (*)    Squamous Epithelial / LPF 6-30 (*)    All other components within normal limits  LIPASE, BLOOD  TYPE AND SCREEN   ____________________________________________  EKG  None ____________________________________________  RADIOLOGY   ____________________________________________   PROCEDURES  Procedure(s) performed: No    Critical Care performed: No ____________________________________________   INITIAL IMPRESSION / ASSESSMENT AND PLAN / ED COURSE  Pertinent labs & imaging results that were available during my care of the patient were reviewed by me and considered in my medical decision making (see chart for details).  Patient well-appearing and in no acute distress, she complained of significant nausea and vomiting. She is treated with IV Zofran and fluids with significant improvement in symptoms. She was able to tolerate by mouth's and reports she was ready to go home. We will discharge her with by mouth Zofran. She notes she can return at any time. Lab work is reassuring      ____________________________________________   FINAL CLINICAL IMPRESSION(S) / ED DIAGNOSES  Final diagnoses:  Nausea and vomiting, intractability of vomiting not specified, unspecified vomiting type      NEW MEDICATIONS STARTED DURING THIS VISIT:  Discharge Medication List as of 11/15/2016  5:57 PM    START taking these medications   Details  ondansetron (ZOFRAN ODT) 4 MG disintegrating tablet Take 1 tablet (4 mg total) by mouth every 8 (eight) hours as needed for nausea or vomiting., Starting Sat 11/15/2016, Print         Note:  This document was prepared using Dragon voice recognition software and may include unintentional dictation errors.    Jene EveryKinner, Brevyn Ring, MD 11/15/16 2043

## 2016-11-20 DIAGNOSIS — R634 Abnormal weight loss: Secondary | ICD-10-CM | POA: Insufficient documentation

## 2016-11-20 DIAGNOSIS — I7300 Raynaud's syndrome without gangrene: Secondary | ICD-10-CM | POA: Insufficient documentation

## 2016-11-20 DIAGNOSIS — R21 Rash and other nonspecific skin eruption: Secondary | ICD-10-CM | POA: Insufficient documentation

## 2016-11-20 DIAGNOSIS — M2550 Pain in unspecified joint: Secondary | ICD-10-CM | POA: Insufficient documentation

## 2016-11-24 ENCOUNTER — Emergency Department
Admission: EM | Admit: 2016-11-24 | Discharge: 2016-11-24 | Disposition: A | Discharge status: Home / Self Care | Payer: Medicaid Other | Attending: Emergency Medicine | Admitting: Emergency Medicine

## 2016-11-24 ENCOUNTER — Encounter: Payer: Self-pay | Admitting: Emergency Medicine

## 2016-11-24 ENCOUNTER — Emergency Department: Payer: Medicaid Other

## 2016-11-24 DIAGNOSIS — Z79899 Other long term (current) drug therapy: Secondary | ICD-10-CM | POA: Diagnosis not present

## 2016-11-24 DIAGNOSIS — F17200 Nicotine dependence, unspecified, uncomplicated: Secondary | ICD-10-CM | POA: Insufficient documentation

## 2016-11-24 DIAGNOSIS — R0789 Other chest pain: Secondary | ICD-10-CM | POA: Insufficient documentation

## 2016-11-24 DIAGNOSIS — I10 Essential (primary) hypertension: Secondary | ICD-10-CM | POA: Insufficient documentation

## 2016-11-24 DIAGNOSIS — R079 Chest pain, unspecified: Secondary | ICD-10-CM

## 2016-11-24 LAB — ETHANOL: Alcohol, Ethyl (B): <5 mg/dL (ref <5)

## 2016-11-24 LAB — BASIC METABOLIC PANEL
Anion gap: 9 (ref 5–15)
BUN: 14 mg/dL (ref 6–20)
CALCIUM: 9.9 mg/dL (ref 8.9–10.3)
CO2: 28 mmol/L (ref 22–32)
CREATININE: 0.93 mg/dL (ref 0.44–1.00)
Chloride: 102 mmol/L (ref 101–111)
GFR calc Af Amer: >60 mL/min (ref >60)
GLUCOSE: 88 mg/dL (ref 65–99)
Potassium: 3.8 mmol/L (ref 3.5–5.1)
SODIUM: 139 mmol/L (ref 135–145)

## 2016-11-24 LAB — CBC
HCT: 44.8 % (ref 35.0–47.0)
Hemoglobin: 15.1 g/dL (ref 12.0–16.0)
MCH: 28.8 pg (ref 26.0–34.0)
MCHC: 33.7 g/dL (ref 32.0–36.0)
MCV: 85.5 fL (ref 80.0–100.0)
PLATELETS: 299 10*3/uL (ref 150–440)
RBC: 5.23 MIL/uL — ABNORMAL HIGH (ref 3.80–5.20)
RDW: 12.4 % (ref 11.5–14.5)
WBC: 8.4 10*3/uL (ref 3.6–11.0)

## 2016-11-24 LAB — URINE DRUG SCREEN, QUALITATIVE (ARMC ONLY)
AMPHETAMINES, UR SCREEN: POSITIVE — AB
BARBITURATES, UR SCREEN: NOT DETECTED
BENZODIAZEPINE, UR SCRN: POSITIVE — AB
COCAINE METABOLITE, UR ~~LOC~~: NOT DETECTED
Cannabinoid 50 Ng, Ur ~~LOC~~: NOT DETECTED
MDMA (Ecstasy)Ur Screen: NOT DETECTED
METHADONE SCREEN, URINE: NOT DETECTED
OPIATE, UR SCREEN: NOT DETECTED
Phencyclidine (PCP) Ur S: NOT DETECTED
Tricyclic, Ur Screen: POSITIVE — AB

## 2016-11-24 LAB — TROPONIN I: Troponin I: <0.03 ng/mL (ref <0.03)

## 2016-11-24 MED ORDER — MORPHINE SULFATE (PF) 4 MG/ML IV SOLN
4.0000 mg | Freq: Once | INTRAVENOUS | Status: AC
  Administered 2016-11-24: 4 mg via INTRAVENOUS
  Filled 2016-11-24: qty 1

## 2016-11-24 NOTE — ED Provider Notes (Signed)
[facility] Emergency Department Provider Note       Time seen: ----------------------------------------- 12:09 PM on 11/24/2016 -----------------------------------------     I have reviewed the triage vital signs and the nursing notes.   HISTORY   Chief Complaint No chief complaint on file.    HPI Cynthia Kramer is a 46 y.o. female who presents to the ED for chest pain. She reports central chest pain associated with sweating and nausea. Patient states symptom onset was last night but she tried to manage it at home before coming into the ER by EMS today. She states is never been this way before, nothing makes it better or worse. She denies any history of same.   Past Medical History:  Diagnosis Date  . GERD (gastroesophageal reflux disease)   . Hypertension     There are no active problems to display for this patient.   Past Surgical History:  Procedure Laterality Date  . ABDOMINAL HYSTERECTOMY    . CHOLECYSTECTOMY    . HAND SURGERY    . TONSILLECTOMY      Allergies Phenergan [promethazine hcl] and Ultram [tramadol]  Social History Social History  Substance Use Topics  . Smoking status: Current Some Day Smoker  . Smokeless tobacco: Never Used  . Alcohol use No    Review of Systems Constitutional: Negative for fever. Eyes: Negative for vision changes ENT:  Negative for congestion, sore throat Cardiovascular: Positive for chest pain Respiratory: Negative for shortness of breath. Gastrointestinal: Negative for abdominal pain, positive for nausea vomiting Genitourinary: Negative for dysuria. Musculoskeletal: Negative for back pain. Skin: Positive for sweats Neurological: Negative for headaches, focal weakness or numbness.  All systems negative/normal/unremarkable except as stated in the HPI  ____________________________________________   PHYSICAL EXAM:  VITAL SIGNS: ED Triage Vitals  Enc Vitals Group     BP      Pulse    Resp      Temp      Temp src      SpO2      Weight      Height      Head Circumference      Peak Flow      Pain Score      Pain Loc      Pain Edu?      Excl. in GC?    Constitutional: Alert and oriented. Well appearing and in no distress. Eyes: Conjunctivae are normal. PERRL. Normal extraocular movements. ENT   Head: Normocephalic and atraumatic.   Nose: No congestion/rhinnorhea.   Mouth/Throat: Mucous membranes are moist.   Neck: No stridor. Cardiovascular: Normal rate, regular rhythm. No murmurs, rubs, or gallops. Respiratory: Normal respiratory effort without tachypnea nor retractions. Breath sounds are clear and equal bilaterally. No wheezes/rales/rhonchi. Gastrointestinal: Soft and nontender. Normal bowel sounds Musculoskeletal: Nontender with normal range of motion in extremities. No lower extremity tenderness nor edema. Neurologic:  Normal speech and language. No gross focal neurologic deficits are appreciated.  Skin:  Skin is warm, dry and intact. No rash noted. Psychiatric: Mood and affect are normal. Speech and behavior are normal.  ____________________________________________  EKG: Interpreted by me. Sinus rhythm rate of 76 bpm, normal PR interval, normal QRS, normal QT.  ____________________________________________  ED COURSE:  Pertinent labs & imaging results that were available during my care of the patient were reviewed by me and considered in my medical decision making (see chart for details). Patient presents for chest pain, we will assess with labs and imaging as indicated.  Procedures ____________________________________________   LABS (pertinent positives/negatives)  Labs Reviewed  CBC - Abnormal; Notable for the following:       Result Value   RBC 5.23 (*)    All other components within normal limits  URINE DRUG SCREEN, QUALITATIVE ([facility] ONLY) - Abnormal; Notable for the following:    Tricyclic, Ur Screen POSITIVE (*)     Amphetamines, Ur Screen POSITIVE (*)    Benzodiazepine, Ur Scrn POSITIVE (*)    All other components within normal limits  BASIC METABOLIC PANEL  TROPONIN I  ETHANOL    RADIOLOGY  Chest x-ray IMPRESSION: Negative chest ____________________________________________  FINAL ASSESSMENT AND PLAN  Chest Pain  Plan: Patient's labs and imaging were dictated above. Patient had presented for chest pain of uncertain etiology. Drug screen was positive for amphetamines which she states she did not take. Overall she appears cleared for outpatient follow-up.   Emily FilbertWilliams, Lenoria Narine E, MD   Note: This note was generated in part or whole with voice recognition software. Voice recognition is usually quite accurate but there are transcription errors that can and very often do occur. I apologize for any typographical errors that were not detected and corrected.     Emily FilbertWilliams, Raegan Sipp E, MD 11/24/16 1355

## 2016-11-24 NOTE — ED Triage Notes (Signed)
Patient presents to ED via [facility] from home with c/o substernal CP that began last night around 2300. Hx of HTN and GERD. Patient describes it as a heaviness. Patient also c/o nausea. EMS gave 324 asa, 4 mg zofran and 1 nitro tab in route which brought patients pain from an 8/10 to a 6/10. A&O x4. Denies SOB.

## 2017-03-10 ENCOUNTER — Emergency Department: Payer: Self-pay

## 2017-03-10 ENCOUNTER — Encounter: Payer: Self-pay | Admitting: *Deleted

## 2017-03-10 ENCOUNTER — Emergency Department
Admission: EM | Admit: 2017-03-10 | Discharge: 2017-03-10 | Disposition: A | Discharge status: Home / Self Care | Payer: Self-pay | Attending: Emergency Medicine | Admitting: Emergency Medicine

## 2017-03-10 DIAGNOSIS — I10 Essential (primary) hypertension: Secondary | ICD-10-CM | POA: Insufficient documentation

## 2017-03-10 DIAGNOSIS — R1012 Left upper quadrant pain: Secondary | ICD-10-CM

## 2017-03-10 DIAGNOSIS — Z79899 Other long term (current) drug therapy: Secondary | ICD-10-CM | POA: Insufficient documentation

## 2017-03-10 DIAGNOSIS — F17200 Nicotine dependence, unspecified, uncomplicated: Secondary | ICD-10-CM | POA: Insufficient documentation

## 2017-03-10 DIAGNOSIS — K920 Hematemesis: Secondary | ICD-10-CM | POA: Insufficient documentation

## 2017-03-10 LAB — CBC
HCT: 42 % (ref 35.0–47.0)
Hemoglobin: 14.6 g/dL (ref 12.0–16.0)
MCH: 30.7 pg (ref 26.0–34.0)
MCHC: 34.8 g/dL (ref 32.0–36.0)
MCV: 88.2 fL (ref 80.0–100.0)
PLATELETS: 278 10*3/uL (ref 150–440)
RBC: 4.76 MIL/uL (ref 3.80–5.20)
RDW: 12.1 % (ref 11.5–14.5)
WBC: 6.7 10*3/uL (ref 3.6–11.0)

## 2017-03-10 LAB — COMPREHENSIVE METABOLIC PANEL
ALBUMIN: 4.1 g/dL (ref 3.5–5.0)
ALT: 26 U/L (ref 14–54)
AST: 25 U/L (ref 15–41)
Alkaline Phosphatase: 76 U/L (ref 38–126)
Anion gap: 10 (ref 5–15)
BUN: 18 mg/dL (ref 6–20)
CHLORIDE: 105 mmol/L (ref 101–111)
CO2: 27 mmol/L (ref 22–32)
CREATININE: 0.96 mg/dL (ref 0.44–1.00)
Calcium: 9.5 mg/dL (ref 8.9–10.3)
GFR calc Af Amer: >60 mL/min (ref >60)
GLUCOSE: 99 mg/dL (ref 65–99)
Potassium: 3.6 mmol/L (ref 3.5–5.1)
Sodium: 142 mmol/L (ref 135–145)
Total Bilirubin: 0.9 mg/dL (ref 0.3–1.2)
Total Protein: 7 g/dL (ref 6.5–8.1)

## 2017-03-10 LAB — URINALYSIS, COMPLETE (UACMP) WITH MICROSCOPIC
Bacteria, UA: NONE SEEN
Bilirubin Urine: NEGATIVE
Glucose, UA: NEGATIVE mg/dL
Hgb urine dipstick: NEGATIVE
Ketones, ur: 5 mg/dL — AB
LEUKOCYTES UA: NEGATIVE
Nitrite: NEGATIVE
PH: 5 (ref 5.0–8.0)
Protein, ur: NEGATIVE mg/dL
SPECIFIC GRAVITY, URINE: 1.023 (ref 1.005–1.030)

## 2017-03-10 LAB — LIPASE, BLOOD: LIPASE: 30 U/L (ref 11–51)

## 2017-03-10 MED ORDER — OXYCODONE HCL 5 MG PO TABS
5.0000 mg | ORAL_TABLET | Freq: Once | ORAL | Status: AC
  Administered 2017-03-10: 5 mg via ORAL
  Filled 2017-03-10: qty 1

## 2017-03-10 MED ORDER — ONDANSETRON 4 MG PO TBDP: 4.0000 mg | ORAL_TABLET | Freq: Three times a day (TID) | ORAL | 0 refills | Status: DC | PRN

## 2017-03-10 MED ORDER — IOPAMIDOL (ISOVUE-300) INJECTION 61%
100.0000 mL | Freq: Once | INTRAVENOUS | Status: AC | PRN
  Administered 2017-03-10: 100 mL via INTRAVENOUS

## 2017-03-10 MED ORDER — OXYCODONE-ACETAMINOPHEN 5-325 MG PO TABS: 1.0000 | ORAL_TABLET | Freq: Four times a day (QID) | ORAL | 0 refills | Status: DC | PRN

## 2017-03-10 MED ORDER — ALUM & MAG HYDROXIDE-SIMETH 400-400-40 MG/5ML PO SUSP: 5.0000 mL | Freq: Four times a day (QID) | ORAL | 0 refills | Status: AC | PRN

## 2017-03-10 MED ORDER — ACETAMINOPHEN 500 MG PO TABS
1000.0000 mg | ORAL_TABLET | Freq: Once | ORAL | Status: AC
  Administered 2017-03-10: 1000 mg via ORAL
  Filled 2017-03-10: qty 2

## 2017-03-10 MED ORDER — OMEPRAZOLE 40 MG PO CPDR: 40.0000 mg | DELAYED_RELEASE_CAPSULE | Freq: Every day | ORAL | 1 refills | Status: AC

## 2017-03-10 MED ORDER — PANTOPRAZOLE SODIUM 40 MG IV SOLR
40.0000 mg | Freq: Once | INTRAVENOUS | Status: AC
  Administered 2017-03-10: 40 mg via INTRAVENOUS
  Filled 2017-03-10: qty 40

## 2017-03-10 MED ORDER — MORPHINE SULFATE (PF) 4 MG/ML IV SOLN
4.0000 mg | Freq: Once | INTRAVENOUS | Status: AC
  Administered 2017-03-10: 4 mg via INTRAVENOUS
  Filled 2017-03-10: qty 1

## 2017-03-10 MED ORDER — SODIUM CHLORIDE 0.9 % IV BOLUS (SEPSIS)
1000.0000 mL | Freq: Once | INTRAVENOUS | Status: AC
  Administered 2017-03-10: 1000 mL via INTRAVENOUS

## 2017-03-10 MED ORDER — ONDANSETRON HCL 4 MG/2ML IJ SOLN
4.0000 mg | Freq: Once | INTRAMUSCULAR | Status: AC
  Administered 2017-03-10: 4 mg via INTRAVENOUS
  Filled 2017-03-10: qty 2

## 2017-03-10 MED ORDER — GI COCKTAIL ~~LOC~~
30.0000 mL | Freq: Once | ORAL | Status: AC
  Administered 2017-03-10: 30 mL via ORAL
  Filled 2017-03-10: qty 30

## 2017-03-10 NOTE — ED Triage Notes (Signed)
Pt reports right sided abdominal pain with vomiting , t reports vomit looks like coffee grounds, pt denies fever

## 2017-03-10 NOTE — Discharge Instructions (Signed)
DO NOT take any NSAIDs ever (alleve, BC, naprosyn, ibuprofen, motrin). You may only take tylenol for pain. Take omeprazole every day as prescribed. Take maalox as needed for worsening burning pain. Take zofran as needed for nausea. Drink plenty of fluids and advance your diet slowly as tolerated. Return to the ER for coffee ground vomiting, black stools, bloody stools, fever, worsening abdominal pain. Otherwise follow up with your doctor in 2 days and with GI within 1 month for endoscopy.   You have been seen in the Emergency Department (ED) for abdominal pain.  Your evaluation did not identify a clear cause of your symptoms but was generally reassuring.  Abdominal pain has many possible causes. Some aren't serious and get better on their own in a few days. Others need more testing and treatment. If your pain continues or gets worse, you need to be rechecked and may need more tests to find out what is wrong. You may need surgery to correct the problem.   Follow up with your doctor in 12-24 hours if you are still having abdominal pain. Otherwise follow up in 1-3 days for a re-check  Don't ignore new symptoms, such as fever, nausea and vomiting, new or worsening abdominal pain, urination problems, bloody diarrhea or bloody stools, black tarry stools, uncontrollable nausea and vomiting, and dizziness. These may be signs of a more serious problem. If you develop any of these you should be seen by your doctor immediately or return to the ED.   How can you care for yourself at home?  Rest until you feel better.  To prevent dehydration, drink plenty of fluids, enough so that your urine is light yellow or clear like water. Choose water and other caffeine-free clear liquids until you feel better. If you have kidney, heart, or liver disease and have to limit fluids, talk with your doctor before you increase the amount of fluids you drink.  If your stomach is upset, eat mild foods, such as rice, dry toast or  crackers, bananas, and applesauce. Try eating several small meals instead of two or three large ones.  Wait until 48 hours after all symptoms have gone away before you have spicy foods, alcohol, and drinks that contain caffeine.  Do not eat foods that are high in fat.  Avoid anti-inflammatory medicines such as aspirin, ibuprofen (Advil, Motrin), and naproxen (Aleve). These can cause stomach upset. Talk to your doctor if you take daily aspirin for another health problem.  When should you call for help?  Call 911 anytime you think you may need emergency care. For example, call if:  You passed out (lost consciousness).  You pass maroon or very bloody stools.  You vomit blood or what looks like coffee grounds.  You have new, severe belly pain.  Call your doctor now or seek immediate medical care if:  Your pain gets worse, especially if it becomes focused in one area of your belly.  You have a new or higher fever.  Your stools are black and look like tar, or they have streaks of blood.  You have unexpected vaginal bleeding.  You have symptoms of a urinary tract infection. These may include:  Pain when you urinate.  Urinating more often than usual.  Blood in your urine. You are dizzy or lightheaded, or you feel like you may faint. Watch closely for changes in your health, and be sure to contact your doctor if:  You are not getting better after 1 day (24 hours).

## 2017-03-10 NOTE — ED Provider Notes (Signed)
[facility] Emergency Department Provider Note  ____________________________________________  Time seen: Approximately 1:14 PM  I have reviewed the triage vital signs and the nursing notes.   HISTORY  Chief Complaint Abdominal Pain and Emesis   HPI Cynthia Kramer is a 46 y.o. female history of GERD, H. pylori, peptic ulcer disease who presents for evaluation of hematemesis. Patient reports 4 days of epigastric stabbing abdominal pain intermittent and nonradiating associated with several episodes of emesis. Initially patient reports nonbloody nonbilious emesis however since yesterday has been having coffee-ground emesis. She noted black stool as well and has had a few episodes of black diarrhea. No fever or chills. She does take NSAIDs on a daily basis for headaches. She tells me that she was never told not to take NSAIDs. She denies alcohol use. She took 2 Aleve this morning and 2 Tylenol before coming in. No dysuria or hematuria. Patient's passing flatus, no abdominal distention. No dizziness, not on blood thinners, no dysuria or hematuria.  Past Medical History:  Diagnosis Date  . GERD (gastroesophageal reflux disease)   . Hypertension     There are no active problems to display for this patient.   Past Surgical History:  Procedure Laterality Date  . ABDOMINAL HYSTERECTOMY    . CHOLECYSTECTOMY    . HAND SURGERY    . TONSILLECTOMY      Prior to Admission medications   Medication Sig Start Date End Date Taking? Authorizing Provider  hydrochlorothiazide (HYDRODIURIL) 25 MG tablet Take 25 mg by mouth daily.   Yes [provider]  pantoprazole (PROTONIX) 40 MG tablet Take 40 mg by mouth daily.   Yes [provider]  alum & mag hydroxide-simeth (MAALOX MAX) 400-400-40 MG/5ML suspension Take 5 mLs by mouth every 6 (six) hours as needed for indigestion. 03/10/17   Nita SickleVeronese, [place], MD  doxycycline (VIBRAMYCIN) 100 MG capsule Take 1 capsule  (100 mg total) by mouth 2 (two) times daily. Patient not taking: Reported on 11/24/2016 02/07/15   Jolene ProvostPatel, [place], MD  fluocinonide ointment (LIDEX) 0.05 % Apply 1 application topically 4 (four) times daily. Apply to the affected area on the inside of the mouth/lip after meals and before bed daily Patient not taking: Reported on 11/24/2016 11/30/15   Gayla DossGayle, Eryka A, MD  fluticasone [facility](FLONASE) 50 MCG/ACT nasal spray Place 2 sprays into both nostrils daily.    [provider]  omeprazole (PRILOSEC) 40 MG capsule Take 1 capsule (40 mg total) by mouth daily. 03/10/17 03/10/18  Nita SickleVeronese, [place], MD  ondansetron (ZOFRAN ODT) 4 MG disintegrating tablet Take 1 tablet (4 mg total) by mouth every 8 (eight) hours as needed for nausea or vomiting. 03/10/17   Nita SickleVeronese, [place], MD  oxyCODONE (ROXICODONE) 5 MG immediate release tablet Take 1 tablet (5 mg total) by mouth every 6 (six) hours as needed for breakthrough pain. Do not drive while taking this medication. 11/30/15   Gayla DossGayle, Eryka A, MD  oxyCODONE-acetaminophen (ROXICET) 5-325 MG tablet Take 1 tablet by mouth every 6 (six) hours as needed. 03/10/17 03/10/18  Nita SickleVeronese, [place], MD    Allergies Phenergan [promethazine hcl] and Ultram [tramadol]  No family history on file.  Social History Social History  Substance Use Topics  . Smoking status: Current Some Day Smoker  . Smokeless tobacco: Never Used  . Alcohol use No    Review of Systems  Constitutional: Negative for fever. Eyes: Negative for visual changes. ENT: Negative for sore throat. Neck: No neck pain  Cardiovascular: Negative  for chest pain. Respiratory: Negative for shortness of breath. Gastrointestinal: + epigastric abdominal pain, vomiting and diarrhea. Genitourinary: Negative for dysuria. Musculoskeletal: Negative for back pain. Skin: Negative for rash. Neurological: Negative for headaches, weakness or numbness. Psych: No SI or  HI  ____________________________________________   PHYSICAL EXAM:  VITAL SIGNS: Vitals:   03/10/17 1309 03/10/17 1330  BP: (!) 151/100 (!) 148/98  Pulse: 69 66  SpO2: 100% 100%   Constitutional: Alert and oriented. Well appearing and in no apparent distress. HEENT:      Head: Normocephalic and atraumatic.         Eyes: Conjunctivae are normal. Sclera is non-icteric.       Mouth/Throat: Mucous membranes are dry.       Neck: Supple with no signs of meningismus. Cardiovascular: Regular rate and rhythm. No murmurs, gallops, or rubs. 2+ symmetrical distal pulses are present in all extremities. No JVD. Respiratory: Normal respiratory effort. Lungs are clear to auscultation bilaterally. No wheezes, crackles, or rhonchi.  Gastrointestinal: Soft, ttp over the epigastric and LUQ, and non distended with positive bowel sounds. No rebound or guarding.rectal exam showing dark brown stool guaiac-negative Genitourinary: No CVA tenderness. Musculoskeletal: Nontender with normal range of motion in all extremities. No edema, cyanosis, or erythema of extremities. Neurologic: Normal speech and language. Face is symmetric. Moving all extremities. No gross focal neurologic deficits are appreciated. Skin: Skin is warm, dry and intact. No rash noted. Psychiatric: Mood and affect are normal. Speech and behavior are normal.  ____________________________________________   LABS (all labs ordered are listed, but only abnormal results are displayed)  Labs Reviewed  URINALYSIS, COMPLETE (UACMP) WITH MICROSCOPIC - Abnormal; Notable for the following:       Result Value   Color, Urine YELLOW (*)    APPearance CLEAR (*)    Ketones, ur 5 (*)    Squamous Epithelial / LPF 0-5 (*)    All other components within normal limits  LIPASE, BLOOD  COMPREHENSIVE METABOLIC PANEL  CBC   ____________________________________________  EKG  ED ECG REPORT I, Nita Sickle, the attending physician, personally viewed  and interpreted this ECG.  Normal sinus rhythm, rate of 80, normal intervals, normal axis, no ST elevations or depressions.  ____________________________________________  RADIOLOGY  CXR:  Negative, no pneumoperitoneum  CT a/p: negative ____________________________________________   PROCEDURES  Procedure(s) performed: None Procedures Critical Care performed:  None ____________________________________________   INITIAL IMPRESSION / ASSESSMENT AND PLAN / ED COURSE  46 y.o. female history of GERD, H. pylori, peptic ulcer disease who presents for evaluation of hematemesis and epigastric abdominal pain. patient is hemodynamically stable, rectal exam showing brown stool guaiac-negative. No episodes of vomiting in the emergency room. Last vomiting was yesterday evening. She has a history of peptic ulcer disease and has been taking NSAIDs for daily headaches. Abdomen is showing tenderness to palpation of the epigastric and left upper quadrant but no rebound or guarding. Chest x-ray with no evidence of pneumoperitoneum. Blood work showing normal CBC with no leukocytosis, stable hemoglobin, normal CMP, normal lipase. EKG with no evidence of ischemia. Will give protonix, zofran, tylenol, morphine, and IVF and reassess. presentation concerning for peptic ulcer disease versus gastritis. At this time no clinical concern for perforation or active GI bleed.    _________________________ 2:51 PM on 03/10/2017 -----------------------------------------  CT a/p negative for perforation. Labs WNL. Last episode of coffee ground emesis 12 hours ago. No episodes of vomiting in the ED. Rectal negative for active bleeding. Vitals stable, hgb stable. Patient was  instructed to stay away from NSAIDs forever. She was provided with a prescription for omeprazole, Maalox, Zofran, and a small prescription for Percocets. Recommend close follow-up with primary care doctor. Patient was referred to GI for endoscopy. Strict  return precautions for recurrence of coffee-ground emesis, black tarry stools, bloody stools, fever, worsening abdominal pain, or lightheadedness.  Pertinent labs & imaging results that were available during my care of the patient were reviewed by me and considered in my medical decision making (see chart for details).    ____________________________________________   FINAL CLINICAL IMPRESSION(S) / ED DIAGNOSES  Final diagnoses:  Hematemesis with nausea  Left upper quadrant pain      NEW MEDICATIONS STARTED DURING THIS VISIT:  New Prescriptions   ALUM & MAG HYDROXIDE-SIMETH (MAALOX MAX) 400-400-40 MG/5ML SUSPENSION    Take 5 mLs by mouth every 6 (six) hours as needed for indigestion.   OMEPRAZOLE (PRILOSEC) 40 MG CAPSULE    Take 1 capsule (40 mg total) by mouth daily.   ONDANSETRON (ZOFRAN ODT) 4 MG DISINTEGRATING TABLET    Take 1 tablet (4 mg total) by mouth every 8 (eight) hours as needed for nausea or vomiting.   OXYCODONE-ACETAMINOPHEN (ROXICET) 5-325 MG TABLET    Take 1 tablet by mouth every 6 (six) hours as needed.     Note:  This document was prepared using Dragon voice recognition software and may include unintentional dictation errors.    Nita Sickle, MD 03/10/17 [phone]

## 2017-03-17 ENCOUNTER — Ambulatory Visit (INDEPENDENT_AMBULATORY_CARE_PROVIDER_SITE_OTHER): Payer: Self-pay | Admitting: Gastroenterology

## 2017-03-17 ENCOUNTER — Encounter: Payer: Self-pay | Admitting: Gastroenterology

## 2017-03-17 VITALS — BP 135/89 | HR 94 | Temp 98.7°F | Ht 65.0 in | Wt 156.8 lb

## 2017-03-17 DIAGNOSIS — I10 Essential (primary) hypertension: Secondary | ICD-10-CM | POA: Insufficient documentation

## 2017-03-17 DIAGNOSIS — K920 Hematemesis: Secondary | ICD-10-CM

## 2017-03-17 DIAGNOSIS — Z791 Long term (current) use of non-steroidal anti-inflammatories (NSAID): Secondary | ICD-10-CM

## 2017-03-17 DIAGNOSIS — R1012 Left upper quadrant pain: Secondary | ICD-10-CM

## 2017-03-17 MED ORDER — ONDANSETRON HCL 4 MG PO TABS: 4.0000 mg | ORAL_TABLET | Freq: Three times a day (TID) | ORAL | 0 refills | Status: AC | PRN

## 2017-03-17 MED ORDER — SUCRALFATE 1 GM/10ML PO SUSP: 1.0000 g | Freq: Four times a day (QID) | ORAL | 1 refills | Status: DC

## 2017-03-17 NOTE — Addendum Note (Signed)
Addended by: Ardyth ManARTER, Nerida Boivin Z on: 03/17/2017 02:40 PM   Modules accepted: Orders, SmartSet

## 2017-03-17 NOTE — Progress Notes (Signed)
Wyline Mood MD, MRCP(U.K) [address]  [address]  [place], [place] [address]  Main: [phone]  Fax: [phone]   Gastroenterology Consultation  Referring Provider:     Hyman Hopes, MD Primary Care Physician:  Hyman Hopes, MD Primary Gastroenterologist:  Dr. Wyline Mood  Reason for Consultation:     Hematemesis         HPI:   Cynthia Kramer is a 46 y.o. y/o female referred for consultation & management  by Dr. Lawerance Bach, Shellia Cleverly, MD.    She has been referred for hematemesis and LUQ pain. She was seen on 03/10/17 at the ER with 4 day history if abdominal pain , emesis . She had been on NSAID's ona daily basis . CT abdomen showed no acute abnormality. Urine analysis was negative. CBC,CMP,lipase- normal   She says that a few weeks back , started feeling sick , was having headaches, was taking Alleve , 2 tablets a day , most days of the week , last week she says the sickeness got worse, started throwing up , threw up coffee grounds- occurred multiple times a day , last episode was day before. Presently has abdominal pain , epigastric, ongoing for 7 days , getting worse. Not able to eat . Goes to her back occasionally . Eating makes it worse. Bowel movement does not help. Has one bowel movement a day which is soft in nature. Denies any alcohol use. Last dose of alleve was taken a week back. On protonix daily - twice daily, on and empty stomach.      Past Medical History:  Diagnosis Date  . GERD (gastroesophageal reflux disease)   . Hypertension     Past Surgical History:  Procedure Laterality Date  . ABDOMINAL HYSTERECTOMY    . CHOLECYSTECTOMY    . HAND SURGERY    . TONSILLECTOMY      Prior to Admission medications   Medication Sig Start Date End Date Taking? Authorizing Provider  alum & mag hydroxide-simeth (MAALOX MAX) 400-400-40 MG/5ML suspension Take 5 mLs by mouth every 6 (six) hours as needed for indigestion. 03/10/17  Yes Don Perking, [place], MD  Cetirizine  HCl 10 MG CAPS Take by mouth.   Yes [provider]  fluticasone (FLONASE) 50 MCG/ACT nasal spray Place 2 sprays into both nostrils daily.   Yes [provider]  hydrochlorothiazide (HYDRODIURIL) 25 MG tablet Take 25 mg by mouth daily.   Yes [provider]  ibuprofen (ADVIL,MOTRIN) 800 MG tablet Take by mouth. 01/13/15  Yes [provider]  omeprazole (PRILOSEC) 40 MG capsule Take 1 capsule (40 mg total) by mouth daily. 03/10/17 03/10/18 Yes Veronese, [place], MD  ondansetron [facility]) 4 MG tablet Take 4 mg by mouth. 02/03/13  Yes [provider]  oxyCODONE (ROXICODONE) 5 MG immediate release tablet Take 1 tablet (5 mg total) by mouth every 6 (six) hours as needed for breakthrough pain. Do not drive while taking this medication. 11/30/15  Yes Gayla Doss, MD  pantoprazole (PROTONIX) 40 MG tablet Take 40 mg by mouth daily.   Yes [provider]  naproxen sodium (ANAPROX) 220 MG tablet Take 220 mg by mouth.    [provider]    No family history on file.   Social History  Substance Use Topics  . Smoking status: Current Some Day Smoker  . Smokeless tobacco: Never Used  . Alcohol use No    Allergies as of 03/17/2017 - Review Complete 03/17/2017  Allergen  Reaction Noted  . Phenergan [promethazine hcl] Other (See Comments) 11/14/2014  . Promethazine Other (See Comments) 01/17/2015  . Ultram [tramadol] Hives 11/14/2014    Review of Systems:    All systems reviewed and negative except where noted in HPI.   Physical Exam:  Ht  (1.651 m)   Wt 156 lb 12.8 oz (71.1 kg)   BMI 26.09 kg/m  No LMP recorded. Patient has had a hysterectomy. Psych:  Alert and cooperative. Normal mood and affect. General:   Alert,  Well-developed, well-nourished, pleasant and cooperative in NAD Head:  Normocephalic and atraumatic. Eyes:  Sclera clear, no icterus.   Conjunctiva pink. Ears:  Normal auditory acuity. Nose:  No deformity, discharge, or  lesions. Mouth:  No deformity or lesions,oropharynx pink & moist. Neck:  Supple; no masses or thyromegaly. Lungs:  Respirations even and unlabored.  Clear throughout to auscultation.   No wheezes, crackles, or rhonchi. No acute distress. Heart:  Regular rate and rhythm; no murmurs, clicks, rubs, or gallops. Abdomen:  Normal bowel sounds.  No bruits.  Soft, non-tender and non-distended without masses, hepatosplenomegaly or hernias noted.  No guarding or rebound tenderness.    Neurologic:  Alert and oriented x3;  grossly normal neurologically. Skin:  Intact without significant lesions or rashes. No jaundice. Lymph Nodes:  No significant cervical adenopathy. Psych:  Alert and cooperative. Normal mood and affect.  Imaging Studies: Dg Chest 2 View  Result Date: 03/10/2017 CLINICAL DATA:  46 year old female with right-sided abdominal pain and vomiting for the past 3 days. Coffee-ground emesis. EXAM: CHEST  2 VIEW COMPARISON:  Chest x-ray 11/24/2016. FINDINGS: Lung volumes are normal. No consolidative airspace disease. No pleural effusions. No pneumothorax. No pulmonary nodule or mass noted. Pulmonary vasculature and the cardiomediastinal silhouette are within normal limits. Surgical clips project over the right upper quadrant of the abdomen from prior cholecystectomy. IMPRESSION: No radiographic evidence of acute cardiopulmonary disease. Electronically Signed   By: Trudie Reed M.D.   On: 03/10/2017 12:56   Ct Abdomen Pelvis W Contrast  Result Date: 03/10/2017 CLINICAL DATA:  Right abdominal pain, vomiting EXAM: CT ABDOMEN AND PELVIS WITH CONTRAST TECHNIQUE: Multidetector CT imaging of the abdomen and pelvis was performed using the standard protocol following bolus administration of intravenous contrast. CONTRAST:  ISOVUE-300 IOPAMIDOL (ISOVUE-300) INJECTION 61% COMPARISON:  02/18/2014 FINDINGS: Lower chest: Lung bases are clear. Hepatobiliary: Liver is within normal limits. Status post  cholecystectomy. No intrahepatic extrahepatic duct dilatation. Pancreas: Within normal limits. Spleen: Within normal limits. Adrenals/Urinary Tract: Adrenal glands within normal limits. Kidneys are within normal limits.  No hydronephrosis. Bladder is within normal limits. Stomach/Bowel: Stomach is within normal limits. No evidence of bowel obstruction. Normal appendix (series 2/ image 65). Vascular/Lymphatic: No evidence of abdominal aortic aneurysm. No suspicious abdominopelvic lymphadenopathy. Reproductive: Status post hysterectomy. Left ovary is within normal limits.  No right adnexal mass. Other: No abdominopelvic ascites. Musculoskeletal: Mild degenerative changes of the visualized thoracolumbar spine. IMPRESSION: No evidence of bowel obstruction.  Normal appendix. Status post cholecystectomy and hysterectomy. No CT findings to account for the patient's right abdominal pain. Electronically Signed   By: Charline Bills M.D.   On: 03/10/2017 14:35    Assessment and Plan:   Cynthia Kramer is a 46 y.o. y/o female has been referred for hematemesis. Prior Alleve use which could have caused a gastric ulcer.    Plan   1. Stop NSAID use 2. Protonix to be continued 3. EGD 4. Carafate QID, samples provided  I have discussed alternative options, risks & benefits,  which include, but are not limited to, bleeding, infection, perforation,respiratory complication & drug reaction.  The patient agrees with this plan & written consent will be obtained.   Follow up in 6 weeks  Dr Wyline MoodKiran Aldean Pipe MD,MRCP(U.K)

## 2017-03-18 ENCOUNTER — Telehealth: Payer: Self-pay | Admitting: Gastroenterology

## 2017-03-18 ENCOUNTER — Other Ambulatory Visit: Payer: Self-pay

## 2017-03-18 MED ORDER — SUCRALFATE 1 G PO TABS: 1.0000 g | ORAL_TABLET | Freq: Three times a day (TID) | ORAL | 0 refills | Status: DC

## 2017-03-18 NOTE — Telephone Encounter (Signed)
Patient left a voice message that she can't afford the rx that was called in yesterday. It was over 200.00. Can you call in something else or do you have samples? Please call

## 2017-03-18 NOTE — Telephone Encounter (Signed)
Advised pt new Rx sent to [facility].

## 2017-03-18 NOTE — Telephone Encounter (Signed)
Patient LVM regarding medication Dr. Tobi BastosAnna put her. She can't afford the medication as it was $255. Either give her a new Rx or samples. Please call patient.

## 2017-03-23 ENCOUNTER — Ambulatory Visit
Admission: RE | Admit: 2017-03-23 | Discharge: 2017-03-23 | Disposition: A | Discharge status: Home / Self Care | Payer: Self-pay | Source: Ambulatory Visit | Attending: Gastroenterology | Admitting: Gastroenterology

## 2017-03-23 ENCOUNTER — Ambulatory Visit: Payer: Self-pay | Admitting: Anesthesiology

## 2017-03-23 ENCOUNTER — Encounter
Admission: RE | Disposition: A | Discharge status: Home / Self Care | Payer: Self-pay | Source: Ambulatory Visit | Attending: Gastroenterology

## 2017-03-23 DIAGNOSIS — I10 Essential (primary) hypertension: Secondary | ICD-10-CM | POA: Insufficient documentation

## 2017-03-23 DIAGNOSIS — Z87891 Personal history of nicotine dependence: Secondary | ICD-10-CM | POA: Insufficient documentation

## 2017-03-23 DIAGNOSIS — R1012 Left upper quadrant pain: Secondary | ICD-10-CM

## 2017-03-23 DIAGNOSIS — Z79899 Other long term (current) drug therapy: Secondary | ICD-10-CM | POA: Insufficient documentation

## 2017-03-23 DIAGNOSIS — K2950 Unspecified chronic gastritis without bleeding: Secondary | ICD-10-CM | POA: Insufficient documentation

## 2017-03-23 DIAGNOSIS — Z791 Long term (current) use of non-steroidal anti-inflammatories (NSAID): Secondary | ICD-10-CM

## 2017-03-23 DIAGNOSIS — K920 Hematemesis: Secondary | ICD-10-CM

## 2017-03-23 DIAGNOSIS — Z885 Allergy status to narcotic agent status: Secondary | ICD-10-CM | POA: Insufficient documentation

## 2017-03-23 DIAGNOSIS — K219 Gastro-esophageal reflux disease without esophagitis: Secondary | ICD-10-CM | POA: Insufficient documentation

## 2017-03-23 DIAGNOSIS — Z888 Allergy status to other drugs, medicaments and biological substances status: Secondary | ICD-10-CM | POA: Insufficient documentation

## 2017-03-23 HISTORY — PX: ESOPHAGOGASTRODUODENOSCOPY (EGD) WITH PROPOFOL: SHX5813

## 2017-03-23 SURGERY — ESOPHAGOGASTRODUODENOSCOPY (EGD) WITH PROPOFOL
Anesthesia: General

## 2017-03-23 MED ORDER — SODIUM CHLORIDE 0.9 % IV SOLN
INTRAVENOUS | Status: DC
  Administered 2017-03-23: 09:00:00 via INTRAVENOUS
  Administered 2017-03-23: 1000 mL via INTRAVENOUS

## 2017-03-23 MED ORDER — FENTANYL CITRATE (PF) 100 MCG/2ML IJ SOLN
INTRAMUSCULAR | Status: AC
  Filled 2017-03-23: qty 2

## 2017-03-23 MED ORDER — PROPOFOL 500 MG/50ML IV EMUL
INTRAVENOUS | Status: AC
  Filled 2017-03-23: qty 50

## 2017-03-23 MED ORDER — FENTANYL CITRATE (PF) 100 MCG/2ML IJ SOLN
INTRAMUSCULAR | Status: DC | PRN
Start: 2017-03-23 — End: 2017-03-23
  Administered 2017-03-23 (×2): 50 ug via INTRAVENOUS

## 2017-03-23 MED ORDER — DICYCLOMINE HCL 10 MG PO CAPS
10.0000 mg | ORAL_CAPSULE | Freq: Four times a day (QID) | ORAL | 0 refills | Status: DC
Start: 2017-03-23 — End: 2017-04-21

## 2017-03-23 MED ORDER — LIDOCAINE HCL (PF) 2 % IJ SOLN
INTRAMUSCULAR | Status: AC
  Filled 2017-03-23: qty 6

## 2017-03-23 MED ORDER — PROPOFOL 10 MG/ML IV BOLUS
INTRAVENOUS | Status: DC | PRN
  Administered 2017-03-23: 100 mg via INTRAVENOUS

## 2017-03-23 MED ORDER — PROPOFOL 500 MG/50ML IV EMUL
INTRAVENOUS | Status: DC | PRN
  Administered 2017-03-23: 150 ug/kg/min via INTRAVENOUS

## 2017-03-23 MED ORDER — PROPOFOL 10 MG/ML IV BOLUS
INTRAVENOUS | Status: AC
  Filled 2017-03-23: qty 20

## 2017-03-23 MED ORDER — LIDOCAINE 2% (20 MG/ML) 5 ML SYRINGE
INTRAMUSCULAR | Status: DC | PRN
  Administered 2017-03-23: 40 mg via INTRAVENOUS

## 2017-03-23 NOTE — Anesthesia Preprocedure Evaluation (Signed)
Anesthesia Evaluation  Patient identified by MRN, date of birth, ID band Patient awake    Reviewed: Allergy & Precautions, NPO status , Patient's Chart, lab work & pertinent test results  History of Anesthesia Complications Negative for: history of anesthetic complications  Airway Mallampati: II       Dental   Pulmonary neg sleep apnea, neg COPD, Current Smoker,           Cardiovascular hypertension, Pt. on medications (-) Past MI and (-) CHF (-) dysrhythmias (-) Valvular Problems/Murmurs     Neuro/Psych neg Seizures    GI/Hepatic Neg liver ROS, GERD  Medicated,  Endo/Other  neg diabetes  Renal/GU negative Renal ROS     Musculoskeletal   Abdominal   Peds  Hematology   Anesthesia Other Findings   Reproductive/Obstetrics                             Anesthesia Physical Anesthesia Plan  ASA: II  Anesthesia Plan: General   Post-op Pain Management:    Induction: Intravenous  PONV Risk Score and Plan: Propofol infusion  Airway Management Planned: Nasal Cannula  Additional Equipment:   Intra-op Plan:   Post-operative Plan:   Informed Consent: I have reviewed the patients History and Physical, chart, labs and discussed the procedure including the risks, benefits and alternatives for the proposed anesthesia with the patient or authorized representative who has indicated his/her understanding and acceptance.     Plan Discussed with:   Anesthesia Plan Comments:         Anesthesia Quick Evaluation

## 2017-03-23 NOTE — H&P (Signed)
Wyline Mood MD [address] [place], [place] [address] Phone: [phone] Fax : [phone]  Primary Care Physician:  Hyman Hopes, MD Primary Gastroenterologist:  Dr. Wyline Mood   Pre-Procedure History & Physical: HPI:  Cynthia Kramer is a 46 y.o. female is here for an endoscopy.   Past Medical History:  Diagnosis Date  . GERD (gastroesophageal reflux disease)   . Hypertension     Past Surgical History:  Procedure Laterality Date  . ABDOMINAL HYSTERECTOMY    . CHOLECYSTECTOMY    . HAND SURGERY    . TONSILLECTOMY      Prior to Admission medications   Medication Sig Start Date End Date Taking? Authorizing Provider  alum & mag hydroxide-simeth (MAALOX MAX) 400-400-40 MG/5ML suspension Take 5 mLs by mouth every 6 (six) hours as needed for indigestion. 03/10/17  Yes Don Perking, [place], MD  Cetirizine HCl 10 MG CAPS Take by mouth.   Yes [provider]  fluticasone (FLONASE) 50 MCG/ACT nasal spray Place 2 sprays into both nostrils daily.   Yes [provider]  hydrochlorothiazide (HYDRODIURIL) 25 MG tablet Take 25 mg by mouth daily.   Yes [provider]  ibuprofen (ADVIL,MOTRIN) 800 MG tablet Take by mouth. 01/13/15  Yes [provider]  naproxen sodium (ANAPROX) 220 MG tablet Take 220 mg by mouth.   Yes [provider]  omeprazole (PRILOSEC) 40 MG capsule Take 1 capsule (40 mg total) by mouth daily. 03/10/17 03/10/18 Yes Don Perking, [place], MD  ondansetron (ZOFRAN) 4 MG tablet Take 1 tablet (4 mg total) by mouth every 8 (eight) hours as needed for nausea or vomiting. 03/17/17  Yes Wyline Mood, MD  oxyCODONE (ROXICODONE) 5 MG immediate release tablet Take 1 tablet (5 mg total) by mouth every 6 (six) hours as needed for breakthrough pain. Do not drive while taking this medication. 11/30/15  Yes Gayla Doss, MD  pantoprazole (PROTONIX) 40 MG tablet Take 40 mg by mouth daily.   Yes [provider]  sucralfate (CARAFATE) 1 g  tablet Take 1 tablet (1 g total) by mouth 4 (four) times daily -  with meals and at bedtime. 03/18/17 04/17/17 Yes Wyline Mood, MD    Allergies as of 03/17/2017 - Review Complete 03/17/2017  Allergen Reaction Noted  . Phenergan [promethazine hcl] Other (See Comments) 11/14/2014  . Promethazine Other (See Comments) 01/17/2015  . Ultram [tramadol] Hives 11/14/2014    No family history on file.  Social History   Social History  . Marital status: Divorced    Spouse name: N/A  . Number of children: N/A  . Years of education: N/A   Occupational History  . Not on file.   Social History Main Topics  . Smoking status: Former Smoker    Quit date: 12/16/2015  . Smokeless tobacco: Never Used  . Alcohol use No  . Drug use: No  . Sexual activity: Yes   Other Topics Concern  . Not on file   Social History Narrative  . No narrative on file    Review of Systems: See HPI, otherwise negative ROS  Physical Exam: Pulse 92   Temp (!) 96.7 F (35.9 C) (Tympanic)   Resp 16   Ht  (1.651 m)   Wt 155 lb (70.3 kg)   SpO2 100%   BMI 25.79 kg/m  General:   Alert,  pleasant and cooperative in NAD Head:  Normocephalic and atraumatic. Neck:  Supple; no masses or thyromegaly. Lungs:  Clear throughout to  auscultation.    Heart:  Regular rate and rhythm. Abdomen:  Soft, nontender and nondistended. Normal bowel sounds, without guarding, and without rebound.   Neurologic:  Alert and  oriented x4;  grossly normal neurologically.  Impression/Plan: Cynthia Kramer is here for an endoscopy to be performed for hematemesis  Risks, benefits, limitations, and alternatives regarding  endoscopy have been reviewed with the patient.  Questions have been answered.  All parties agreeable.   Wyline Mood, MD  03/23/2017, 9:18 AM

## 2017-03-23 NOTE — Op Note (Signed)
[facility] Gastroenterology Patient Name: Cynthia Kramer Procedure Date: 03/23/2017 9:22 AM MRN: [id number] Account #: [id number] Date of Birth: 1971-04-30 Admit Type: Outpatient Age: 46 Room: [facility] ENDO ROOM 4 Gender: Female Note Status: Finalized Procedure:            Upper GI endoscopy Indications:          Epigastric abdominal pain Providers:            Wyline Mood MD, MD Referring MD:         Renne Musca MD, MD (Referring MD) Medicines:            Monitored Anesthesia Care Complications:        No immediate complications. Procedure:            Pre-Anesthesia Assessment:                       - Prior to the procedure, a History and Physical was                        performed, and patient medications, allergies and                        sensitivities were reviewed. The patient's tolerance of                        previous anesthesia was reviewed.                       - The risks and benefits of the procedure and the                        sedation options and risks were discussed with the                        patient. All questions were answered and informed                        consent was obtained.                       - ASA Grade Assessment: III - A patient with severe                        systemic disease.                       After obtaining informed consent, the endoscope was                        passed under direct vision. Throughout the procedure,                        the patient's blood pressure, pulse, and oxygen                        saturations were monitored continuously. The Endoscope                        was introduced through the mouth, and advanced to the  third part of duodenum. The upper GI endoscopy was                        accomplished with ease. The patient tolerated the                        procedure well. Findings:      The examined duodenum was normal.      The esophagus was normal.  The entire examined stomach was normal. Biopsies were taken with a cold       forceps for histology. Impression:           - Normal examined duodenum.                       - Normal esophagus.                       - Normal stomach. Biopsied. Recommendation:       - Await pathology results.                       - Discharge patient to home (with escort).                       - Resume previous diet.                       - Continue present medications.                       - Await pathology results.                       - Return to my office in 4 weeks. Procedure Code(s):    --- Professional ---                       [phone], Esophagogastroduodenoscopy, flexible, transoral;                        with biopsy, single or multiple Diagnosis Code(s):    --- Professional ---                       R10.13, Epigastric pain CPT copyright 2016 American Medical Association. All rights reserved. The codes documented in this report are preliminary and upon coder review may  be revised to meet current compliance requirements. Wyline Mood, MD Wyline Mood MD, MD 03/23/2017 9:33:51 AM This report has been signed electronically. Number of Addenda: 0 Note Initiated On: 03/23/2017 9:22 AM      [facility]

## 2017-03-23 NOTE — Transfer of Care (Signed)
Immediate Anesthesia Transfer of Care Note  Patient: Cynthia Kramer  Procedure(s) Performed: Procedure(s): ESOPHAGOGASTRODUODENOSCOPY (EGD) WITH PROPOFOL (N/A)  Patient Location: PACU and Endoscopy Unit  Anesthesia Type:General  Level of Consciousness: awake, oriented and patient cooperative  Airway & Oxygen Therapy: Patient Spontanous Breathing and Patient connected to nasal cannula oxygen  Post-op Assessment: Report given to RN and Post -op Vital signs reviewed and stable  Post vital signs: Reviewed  Last Vitals:  Vitals:   03/23/17 0756  Pulse: 92  Resp: 16  Temp: (!) 35.9 C  SpO2: 100%    Last Pain:  Vitals:   03/23/17 0756  TempSrc: Tympanic  PainSc: 7          Complications: No apparent anesthesia complications

## 2017-03-23 NOTE — Anesthesia Postprocedure Evaluation (Signed)
Anesthesia Post Note  Patient: Cynthia Kramer  Procedure(s) Performed: Procedure(s) (LRB): ESOPHAGOGASTRODUODENOSCOPY (EGD) WITH PROPOFOL (N/A)  Patient location during evaluation: Endoscopy Anesthesia Type: General Level of consciousness: awake and alert Pain management: pain level controlled Vital Signs Assessment: post-procedure vital signs reviewed and stable Respiratory status: spontaneous breathing and respiratory function stable Cardiovascular status: stable Anesthetic complications: no     Last Vitals:  Vitals:   03/23/17 0756 03/23/17 0939  BP:  130/84  Pulse: 92 83  Resp: 16 (!) 9  Temp: (!) 35.9 C (!) 36.3 C  SpO2: 100% 100%    Last Pain:  Vitals:   03/23/17 0939  TempSrc: Tympanic  PainSc:                  KEPHART,WILLIAM K

## 2017-03-23 NOTE — Anesthesia Post-op Follow-up Note (Signed)
Anesthesia QCDR form completed.        

## 2017-03-24 ENCOUNTER — Encounter: Payer: Self-pay | Admitting: Gastroenterology

## 2017-03-24 LAB — SURGICAL PATHOLOGY

## 2017-03-25 ENCOUNTER — Encounter: Payer: Self-pay | Admitting: Gastroenterology

## 2017-03-26 ENCOUNTER — Ambulatory Visit: Payer: Self-pay | Admitting: Gastroenterology

## 2017-04-19 ENCOUNTER — Emergency Department: Payer: Self-pay

## 2017-04-19 ENCOUNTER — Inpatient Hospital Stay
Admission: EM | Admit: 2017-04-19 | Discharge: 2017-04-21 | DRG: 603 | Disposition: A | Discharge status: Home / Self Care | Payer: Self-pay | Attending: Internal Medicine | Admitting: Internal Medicine

## 2017-04-19 ENCOUNTER — Encounter: Payer: Self-pay | Admitting: Radiology

## 2017-04-19 DIAGNOSIS — L03211 Cellulitis of face: Principal | ICD-10-CM

## 2017-04-19 DIAGNOSIS — Z885 Allergy status to narcotic agent status: Secondary | ICD-10-CM

## 2017-04-19 DIAGNOSIS — R220 Localized swelling, mass and lump, head: Secondary | ICD-10-CM

## 2017-04-19 DIAGNOSIS — J029 Acute pharyngitis, unspecified: Secondary | ICD-10-CM

## 2017-04-19 DIAGNOSIS — Z90710 Acquired absence of both cervix and uterus: Secondary | ICD-10-CM

## 2017-04-19 DIAGNOSIS — L0292 Furuncle, unspecified: Secondary | ICD-10-CM | POA: Diagnosis present

## 2017-04-19 DIAGNOSIS — I10 Essential (primary) hypertension: Secondary | ICD-10-CM | POA: Diagnosis present

## 2017-04-19 DIAGNOSIS — Z87891 Personal history of nicotine dependence: Secondary | ICD-10-CM

## 2017-04-19 DIAGNOSIS — K219 Gastro-esophageal reflux disease without esophagitis: Secondary | ICD-10-CM | POA: Diagnosis present

## 2017-04-19 DIAGNOSIS — R1310 Dysphagia, unspecified: Secondary | ICD-10-CM | POA: Diagnosis present

## 2017-04-19 DIAGNOSIS — Z888 Allergy status to other drugs, medicaments and biological substances status: Secondary | ICD-10-CM

## 2017-04-19 LAB — CBC WITH DIFFERENTIAL/PLATELET
Basophils Absolute: 0 10*3/uL (ref 0–0.1)
Basophils Relative: 0 %
EOS PCT: 4 %
Eosinophils Absolute: 0.6 10*3/uL (ref 0–0.7)
HCT: 41.9 % (ref 35.0–47.0)
Hemoglobin: 14.6 g/dL (ref 12.0–16.0)
LYMPHS ABS: 2.1 10*3/uL (ref 1.0–3.6)
LYMPHS PCT: 15 %
MCH: 30.8 pg (ref 26.0–34.0)
MCHC: 34.8 g/dL (ref 32.0–36.0)
MCV: 88.6 fL (ref 80.0–100.0)
MONO ABS: 1.1 10*3/uL — AB (ref 0.2–0.9)
Monocytes Relative: 8 %
Neutro Abs: 9.9 10*3/uL — ABNORMAL HIGH (ref 1.4–6.5)
Neutrophils Relative %: 73 %
PLATELETS: 278 10*3/uL (ref 150–440)
RBC: 4.73 MIL/uL (ref 3.80–5.20)
RDW: 12.4 % (ref 11.5–14.5)
WBC: 13.7 10*3/uL — AB (ref 3.6–11.0)

## 2017-04-19 LAB — COMPREHENSIVE METABOLIC PANEL
ALBUMIN: 4.3 g/dL (ref 3.5–5.0)
ALT: 21 U/L (ref 14–54)
AST: 22 U/L (ref 15–41)
Alkaline Phosphatase: 109 U/L (ref 38–126)
Anion gap: 11 (ref 5–15)
BUN: 15 mg/dL (ref 6–20)
CHLORIDE: 101 mmol/L (ref 101–111)
CO2: 25 mmol/L (ref 22–32)
Calcium: 9.5 mg/dL (ref 8.9–10.3)
Creatinine, Ser: 0.86 mg/dL (ref 0.44–1.00)
GFR calc Af Amer: >60 mL/min (ref >60)
GFR calc non Af Amer: >60 mL/min (ref >60)
GLUCOSE: 109 mg/dL — AB (ref 65–99)
POTASSIUM: 3.9 mmol/L (ref 3.5–5.1)
SODIUM: 137 mmol/L (ref 135–145)
Total Bilirubin: 1.8 mg/dL — ABNORMAL HIGH (ref 0.3–1.2)
Total Protein: 8 g/dL (ref 6.5–8.1)

## 2017-04-19 LAB — LACTIC ACID, PLASMA
Lactic Acid, Venous: 1.2 mmol/L (ref 0.5–1.9)
Lactic Acid, Venous: 1.1 mmol/L (ref 0.5–1.9)

## 2017-04-19 LAB — MRSA PCR SCREENING: MRSA BY PCR: POSITIVE — AB

## 2017-04-19 MED ORDER — ACETAMINOPHEN 325 MG PO TABS
650.0000 mg | ORAL_TABLET | Freq: Four times a day (QID) | ORAL | Status: DC | PRN
  Administered 2017-04-20: 650 mg via ORAL
  Filled 2017-04-19: qty 2

## 2017-04-19 MED ORDER — DEXAMETHASONE SODIUM PHOSPHATE 10 MG/ML IJ SOLN
10.0000 mg | Freq: Once | INTRAMUSCULAR | Status: AC
  Administered 2017-04-19: 10 mg via INTRAVENOUS
  Filled 2017-04-19: qty 1

## 2017-04-19 MED ORDER — IOPAMIDOL (ISOVUE-300) INJECTION 61%
75.0000 mL | Freq: Once | INTRAVENOUS | Status: AC | PRN
  Administered 2017-04-19: 75 mL via INTRAVENOUS

## 2017-04-19 MED ORDER — VANCOMYCIN HCL IN DEXTROSE 1-5 GM/200ML-% IV SOLN
1000.0000 mg | Freq: Two times a day (BID) | INTRAVENOUS | Status: DC
  Administered 2017-04-20 (×2): 1000 mg via INTRAVENOUS
  Filled 2017-04-19 (×6): qty 200

## 2017-04-19 MED ORDER — VANCOMYCIN HCL IN DEXTROSE 1-5 GM/200ML-% IV SOLN
1000.0000 mg | Freq: Once | INTRAVENOUS | Status: AC
  Administered 2017-04-19: 1000 mg via INTRAVENOUS
  Filled 2017-04-19: qty 200

## 2017-04-19 MED ORDER — METHYLPREDNISOLONE SODIUM SUCC 125 MG IJ SOLR
60.0000 mg | Freq: Two times a day (BID) | INTRAMUSCULAR | Status: DC
  Administered 2017-04-19 – 2017-04-20 (×2): 60 mg via INTRAVENOUS
  Filled 2017-04-19 (×2): qty 2

## 2017-04-19 MED ORDER — HYDROCHLOROTHIAZIDE 25 MG PO TABS
25.0000 mg | ORAL_TABLET | Freq: Every day | ORAL | Status: DC
  Administered 2017-04-20 – 2017-04-21 (×2): 25 mg via ORAL
  Filled 2017-04-19 (×2): qty 1

## 2017-04-19 MED ORDER — PIPERACILLIN-TAZOBACTAM 3.375 G IVPB 30 MIN
3.3750 g | Freq: Once | INTRAVENOUS | Status: AC
  Administered 2017-04-19: 3.375 g via INTRAVENOUS

## 2017-04-19 MED ORDER — DOCUSATE SODIUM 100 MG PO CAPS
100.0000 mg | ORAL_CAPSULE | Freq: Two times a day (BID) | ORAL | Status: DC
  Administered 2017-04-19 – 2017-04-21 (×4): 100 mg via ORAL
  Filled 2017-04-19 (×4): qty 1

## 2017-04-19 MED ORDER — HEPARIN SODIUM (PORCINE) 5000 UNIT/ML IJ SOLN
5000.0000 [IU] | Freq: Three times a day (TID) | INTRAMUSCULAR | Status: DC
  Administered 2017-04-19 – 2017-04-21 (×6): 5000 [IU] via SUBCUTANEOUS
  Filled 2017-04-19 (×6): qty 1

## 2017-04-19 MED ORDER — ONDANSETRON HCL 4 MG/2ML IJ SOLN: 4.0000 mg | Freq: Four times a day (QID) | INTRAMUSCULAR | Status: DC | PRN

## 2017-04-19 MED ORDER — PIPERACILLIN-TAZOBACTAM 3.375 G IVPB
3.3750 g | Freq: Three times a day (TID) | INTRAVENOUS | Status: DC
  Administered 2017-04-19 – 2017-04-21 (×5): 3.375 g via INTRAVENOUS
  Filled 2017-04-19 (×8): qty 50

## 2017-04-19 MED ORDER — DICYCLOMINE HCL 10 MG PO CAPS
10.0000 mg | ORAL_CAPSULE | Freq: Four times a day (QID) | ORAL | Status: DC
  Administered 2017-04-19 – 2017-04-20 (×2): 10 mg via ORAL
  Filled 2017-04-19 (×2): qty 1

## 2017-04-19 MED ORDER — PANTOPRAZOLE SODIUM 40 MG IV SOLR
40.0000 mg | Freq: Two times a day (BID) | INTRAVENOUS | Status: DC
  Administered 2017-04-19 (×2): 40 mg via INTRAVENOUS
  Filled 2017-04-19 (×2): qty 40

## 2017-04-19 MED ORDER — FLUTICASONE PROPIONATE 50 MCG/ACT NA SUSP
2.0000 | Freq: Every day | NASAL | Status: DC
  Administered 2017-04-19 – 2017-04-21 (×3): 2 via NASAL
  Filled 2017-04-19: qty 16

## 2017-04-19 MED ORDER — MORPHINE SULFATE (PF) 4 MG/ML IV SOLN
4.0000 mg | Freq: Once | INTRAVENOUS | Status: AC
  Administered 2017-04-19: 4 mg via INTRAVENOUS
  Filled 2017-04-19: qty 1

## 2017-04-19 MED ORDER — BISACODYL 10 MG RE SUPP: 10.0000 mg | Freq: Every day | RECTAL | Status: DC | PRN

## 2017-04-19 MED ORDER — ACETAMINOPHEN 650 MG RE SUPP: 650.0000 mg | Freq: Four times a day (QID) | RECTAL | Status: DC | PRN

## 2017-04-19 MED ORDER — ONDANSETRON HCL 4 MG PO TABS: 4.0000 mg | ORAL_TABLET | Freq: Four times a day (QID) | ORAL | Status: DC | PRN

## 2017-04-19 MED ORDER — LOSARTAN POTASSIUM 50 MG PO TABS
50.0000 mg | ORAL_TABLET | Freq: Every day | ORAL | Status: DC
  Administered 2017-04-19 – 2017-04-20 (×2): 50 mg via ORAL
  Filled 2017-04-19 (×2): qty 1

## 2017-04-19 MED ORDER — LIDOCAINE HCL 2 % EX GEL
CUTANEOUS | Status: AC
  Filled 2017-04-19: qty 10

## 2017-04-19 MED ORDER — METOPROLOL TARTRATE 25 MG PO TABS
25.0000 mg | ORAL_TABLET | Freq: Two times a day (BID) | ORAL | Status: DC
  Administered 2017-04-19 – 2017-04-20 (×3): 25 mg via ORAL
  Filled 2017-04-19 (×3): qty 1

## 2017-04-19 MED ORDER — SODIUM CHLORIDE 0.9 % IV BOLUS (SEPSIS)
1000.0000 mL | Freq: Once | INTRAVENOUS | Status: AC
  Administered 2017-04-19: 1000 mL via INTRAVENOUS

## 2017-04-19 MED ORDER — DIPHENHYDRAMINE HCL 50 MG/ML IJ SOLN
50.0000 mg | Freq: Once | INTRAMUSCULAR | Status: AC
  Administered 2017-04-19: 50 mg via INTRAVENOUS
  Filled 2017-04-19: qty 1

## 2017-04-19 MED ORDER — SODIUM CHLORIDE 0.9 % IV SOLN
INTRAVENOUS | Status: DC
  Administered 2017-04-19 – 2017-04-20 (×2): via INTRAVENOUS

## 2017-04-19 MED ORDER — DIPHENHYDRAMINE HCL 25 MG PO CAPS
50.0000 mg | ORAL_CAPSULE | Freq: Three times a day (TID) | ORAL | Status: DC | PRN
  Filled 2017-04-19: qty 2

## 2017-04-19 MED ORDER — LORATADINE 10 MG PO TABS
10.0000 mg | ORAL_TABLET | Freq: Every day | ORAL | Status: DC
Start: 2017-04-19 — End: 2017-04-21
  Administered 2017-04-19 – 2017-04-21 (×3): 10 mg via ORAL
  Filled 2017-04-19 (×3): qty 1

## 2017-04-19 MED ORDER — DIPHENHYDRAMINE HCL 25 MG PO CAPS
25.0000 mg | ORAL_CAPSULE | Freq: Three times a day (TID) | ORAL | Status: DC | PRN
  Administered 2017-04-19: 25 mg via ORAL
  Filled 2017-04-19: qty 1

## 2017-04-19 MED ORDER — MORPHINE SULFATE (PF) 2 MG/ML IV SOLN
2.0000 mg | INTRAVENOUS | Status: DC | PRN
  Administered 2017-04-19 (×3): 2 mg via INTRAVENOUS
  Filled 2017-04-19 (×3): qty 1

## 2017-04-19 MED ORDER — ONDANSETRON HCL 4 MG/2ML IJ SOLN
4.0000 mg | Freq: Once | INTRAMUSCULAR | Status: AC
  Administered 2017-04-19: 4 mg via INTRAVENOUS
  Filled 2017-04-19: qty 2

## 2017-04-19 NOTE — Consult Note (Signed)
Cynthia Kramer, Cynthia Kramer [id number] 1971/06/11 Cynthia Nearing, MD  Reason for Consult: difficulty swallowing Requesting Physician: Cynthia Kitten, MD Consulting Physician: Cynthia Kramer  HPI: This 46 y.o. year old female was admitted on 04/19/2017 for facial swelling. Woke up this morning with left facial swelling surrounding an area that may have started as a pimple yesterday. She also wondered if she might have had a bug bite. She relates a history of prior right-sided facial cellulitis requiring hospital admission in the past, though there were no positive cultures to determine whether this was MRSA related or not. She reported to Dr.Quale that it felt a little tight to swallow but she is not having any voice change or difficulty breathing. ENT consultation was requested to assess her airway.  Allergies:  Allergies  Allergen Reactions  . Phenergan [Promethazine Hcl] Other (See Comments)    Cramping   . Promethazine Other (See Comments)    Cramping  . Ultram [Tramadol] Hives    Medications:  (Not in a hospital admission). Current Facility-Administered Medications  Medication Dose Route Frequency Provider Last Rate Last Dose  . lidocaine (XYLOCAINE) 2 % jelly           . piperacillin-tazobactam (ZOSYN) IVPB 3.375 g  3.375 g Intravenous Q8H Cynthia Kramer, [facility]      . vancomycin (VANCOCIN) IVPB 1000 mg/200 mL premix  1,000 mg Intravenous Once Cynthia Kitten, MD 200 mL/hr at 04/19/17 1410 1,000 mg at 04/19/17 1410  . vancomycin (VANCOCIN) IVPB 1000 mg/200 mL premix  1,000 mg Intravenous Q12H Cynthia Kramer, [facility]       Current Outpatient Prescriptions  Medication Sig Dispense Refill  . alum & mag hydroxide-simeth (MAALOX MAX) 400-400-40 MG/5ML suspension Take 5 mLs by mouth every 6 (six) hours as needed for indigestion. 355 mL 0  . Cetirizine HCl 10 MG CAPS Take 10 mg by mouth daily.     Marland Kitchen dicyclomine (BENTYL) 10 MG capsule Take 1 capsule (10 mg total) by mouth 4 (four) times daily. 56 capsule 0  .  fluticasone (FLONASE) 50 MCG/ACT nasal spray Place 2 sprays into both nostrils daily.    . hydrochlorothiazide (HYDRODIURIL) 25 MG tablet Take 25 mg by mouth daily.    . meloxicam (MOBIC) 15 MG tablet Take 15 mg by mouth daily.    Marland Kitchen omeprazole (PRILOSEC) 40 MG capsule Take 1 capsule (40 mg total) by mouth daily. 30 capsule 1  . ondansetron (ZOFRAN) 4 MG tablet Take 1 tablet (4 mg total) by mouth every 8 (eight) hours as needed for nausea or vomiting. 30 tablet 0  . oxyCODONE (ROXICODONE) 5 MG immediate release tablet Take 1 tablet (5 mg total) by mouth every 6 (six) hours as needed for breakthrough pain. Do not drive while taking this medication. (Patient not taking: Reported on 04/19/2017) 8 tablet 0  . sucralfate (CARAFATE) 1 g tablet Take 1 tablet (1 g total) by mouth 4 (four) times daily -  with meals and at bedtime. (Patient not taking: Reported on 04/19/2017) 120 tablet 0    PMH:  Past Medical History:  Diagnosis Date  . GERD (gastroesophageal reflux disease)   . Hypertension     Fam Hx: No family history on file.  Soc Hx:  Social History   Social History  . Marital status: Divorced    Spouse name: N/A  . Number of children: N/A  . Years of education: N/A   Occupational History  . Not on file.   Social History Main Topics  .  Smoking status: Former Smoker    Quit date: 12/16/2015  . Smokeless tobacco: Never Used  . Alcohol use No  . Drug use: No  . Sexual activity: Yes   Other Topics Concern  . Not on file   Social History Narrative  . No narrative on file    PSH:  Past Surgical History:  Procedure Laterality Date  . ABDOMINAL HYSTERECTOMY    . CHOLECYSTECTOMY    . ESOPHAGOGASTRODUODENOSCOPY (EGD) WITH PROPOFOL N/A 03/23/2017   Procedure: ESOPHAGOGASTRODUODENOSCOPY (EGD) WITH PROPOFOL;  Surgeon: Jonathon Bellows, MD;  Location: [facility] ENDOSCOPY;  Service: Gastroenterology;  Laterality: N/A;  . HAND SURGERY    . TONSILLECTOMY    . Procedures since admission: No  admission procedures for hospital encounter.  ROS: Review of systems normal other than 12 systems except per HPI.  PHYSICAL EXAM Vitals:  Vitals:   04/19/17 1315  BP: (!) 141/85  Pulse: 93  Resp: 16  Temp: 98.5 F (36.9 C)  SpO2: 95%  . General: Well-developed, Well-nourished in no acute distress Mood: Mood and affect well adjusted, pleasant and cooperative. Orientation: Grossly alert and oriented. Vocal Quality: No hoarseness. Communicates verbally. head and Face: there is facial swelling and erythema involving the soft tissues of the left face, centered around a small scabbed area on the cheek just lateral to the nasal ala. The subcutaneous tissues are more firm here but not fluctuant, and there is no purulent discharge from the scabbed area. The swelling extends up around the eyelid on the left side and this area is tender to palpation. Ears: External ears with normal landmarks, no lesions. External auditory canals free of infection, cerumen impaction or lesions. Tympanic membranes intact with good landmarks and normal mobility on pneumatic otoscopy. No middle ear effusion. Hearing: Speech reception grossly normal. Nose: External nose normal with midline dorsum and no lesions or deformity. Nasal Cavity reveals essentially midline septum with congestion of the left inferior turbinate. There is no purulence seen in the nasal cavity. Nasal secretions are minimal and clear. No polyps seen on anterior rhinoscopy. Oral Cavity/ Oropharynx: Lips are normal with no lesions. Oropharynx including tongue, buccal mucosa, floor of mouth, hard and soft palate, uvula and posterior pharynx free of exudates, erythema or lesions with normal symmetry and hydration.  Indirect Laryngoscopy/Nasopharyngoscopy: Visualization of the larynx, hypopharynx and nasopharynx is not possible in this setting with routine examination. Neck: Supple and symmetric with no palpable masses, tenderness or crepitance. The trachea  is midline. Thyroid gland is soft, nontender and symmetric with no masses or enlargement. Parotid and submandibular glands are soft, nontender and symmetric, without masses. Lymphatic: Cervical lymph nodes are without palpable lymphadenopathy or tenderness. Respiratory: Normal respiratory effort without labored breathing. Cardiovascular: Carotid pulse shows regular rate and rhythm Neurologic: Cranial Nerves II through XII are grossly intact. Eyes: Gaze and Ocular Motility are grossly normal. PERRLA. No visible nystagmus.  MEDICAL DECISION MAKING: Data Review:  Results for orders placed or performed during the hospital encounter of 04/19/17 (from the past 48 hour(s))  Comprehensive metabolic panel     Status: Abnormal   Collection Time: 04/19/17  1:26 PM  Result Value Ref Range   Sodium 137 135 - 145 mmol/L   Potassium 3.9 3.5 - 5.1 mmol/L   Chloride 101 101 - 111 mmol/L   CO2 25 22 - 32 mmol/L   Glucose, Bld 109 (H) 65 - 99 mg/dL   BUN 15 6 - 20 mg/dL   Creatinine, Ser 0.86 0.44 - 1.00 mg/dL  Calcium 9.5 8.9 - 10.3 mg/dL   Total Protein 8.0 6.5 - 8.1 g/dL   Albumin 4.3 3.5 - 5.0 g/dL   AST 22 15 - 41 U/L   ALT 21 14 - 54 U/L   Alkaline Phosphatase 109 38 - 126 U/L   Total Bilirubin 1.8 (H) 0.3 - 1.2 mg/dL   GFR calc non Af Amer >60 >60 mL/min   GFR calc Af Amer >60 >60 mL/min    Comment: (NOTE) The eGFR has been calculated using the CKD EPI equation. This calculation has not been validated in all clinical situations. eGFR's persistently <60 mL/min signify possible Chronic Kidney Disease.    Anion gap 11 5 - 15  CBC WITH DIFFERENTIAL     Status: Abnormal   Collection Time: 04/19/17  1:26 PM  Result Value Ref Range   WBC 13.7 (H) 3.6 - 11.0 K/uL   RBC 4.73 3.80 - 5.20 MIL/uL   Hemoglobin 14.6 12.0 - 16.0 g/dL   HCT 41.9 35.0 - 47.0 %   MCV 88.6 80.0 - 100.0 fL   MCH 30.8 26.0 - 34.0 pg   MCHC 34.8 32.0 - 36.0 g/dL   RDW 12.4 11.5 - 14.5 %   Platelets 278 150 - 440 K/uL    Neutrophils Relative % 73 %   Neutro Abs 9.9 (H) 1.4 - 6.5 K/uL   Lymphocytes Relative 15 %   Lymphs Abs 2.1 1.0 - 3.6 K/uL   Monocytes Relative 8 %   Monocytes Absolute 1.1 (H) 0.2 - 0.9 K/uL   Eosinophils Relative 4 %   Eosinophils Absolute 0.6 0 - 0.7 K/uL   Basophils Relative 0 %   Basophils Absolute 0.0 0 - 0.1 K/uL  Lactic acid, plasma     Status: None   Collection Time: 04/19/17  1:27 PM  Result Value Ref Range   Lactic Acid, Venous 1.2 0.5 - 1.9 mmol/L  . No results found.Marland Kitchen   PROCEDURE: Procedure: Diagnostic Fiberoptic Nasolaryngoscopy Diagnosis: dysphagia Indications: patient with left-sided facial swelling and new-onset of mild dysphagia, rule out airway edema Findings:the nasal cavity nasopharynx were unremarkable.the hypopharynx larynx and tongue base are unremarkable. There is no swelling of the supraglottic larynx. The true vocal cords are mobile and free of mucosal lesions.There may be some very mild erythema and edema of the posterior glottis typical of reflux but there is no evidence of any infection or generalized cellulitis in the airway Description of Procedure: After discussing procedure and risks  (primarily nose bleed) with the patient, the nose was anesthetized with topical Lidocaine 4% and decongested with phenylephrine. A flexible fiberoptic scope was passed through the nasal cavity. The nasal cavity was inspected and the scope passed through the Nasopharynx to the region of the hypopharynx and larynx. The patient was instructed to phonate to assess vocal cord mobility. The tongue was extended to evaluate the tongue base completely. Valsalva was performed to insufflate the hypopharynx for improved examination. Findings are as noted above. The scope was withdrawn. The patient tolerated the procedure well.  ASSESSMENT: patient with left-sided facial cellulitis. She reports some mild dysphagia but there is nothing to correlate the cellulitis with these symptoms.  Reflux could produce some mild degree of dysphagia and there is evidence for this on exam. I don't see any evidence of a drainable facial abscess although CT scan is pending  PLAN: most likely she has facial cellulitis secondary to a facial furuncle. It looks like they've started vancomycin which would be appropriate given  a prior history of this and strong possibility of MRSA. Decadron would be reasonable to help with some of the reduction in swelling. I would also make sure she remains covered for reflux during her hospital admission, but the symptoms and the throat appeared to have no rectal relationship with facial cellulitis. There does not appear to be an immediate need for surgical intervention regarding the infection itself, which can be managed with antibiotics IV until the patient shows improvement. There is no discharge from the wound culture, so an absence of any positive culture results, doxycycline or Septra would be empiric choices for oral antibiotics after she shows enough clinical improvement to be discharged home.   Cynthia Nearing, MD 04/19/2017 2:23 PM

## 2017-04-19 NOTE — ED Notes (Addendum)
This RN noticed red raised rash near Iv site, MD notified, benadryl order obtained, continue antibiotic per Dr. Judithann Sheen.
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
## 2017-04-19 NOTE — ED Notes (Signed)
Both sets of blood cultures drawn prior to antibiotics started.

## 2017-04-19 NOTE — Progress Notes (Signed)
Pharmacy Antibiotic Note  Cynthia Kramer is a 46 y.o. female admitted on 04/19/2017 with cellulitis.  Pharmacy has been consulted for zosyn/vancomycin dosing.  Plan: Vancomycin   IV every 12 hours.  Goal trough 10-15 mcg/mL. Zosyn 3.375g IV q8h (4 hour infusion).  Height:  (165.1 cm) Weight: 155 lb (70.3 kg) IBW/kg (Calculated) : 57  Temp (24hrs), Avg:98.5 F (36.9 C), Min:98.5 F (36.9 C), Max:98.5 F (36.9 C)   Recent Labs Lab 04/19/17 1326 04/19/17 1327  WBC 13.7*  --   CREATININE 0.86  --   LATICACIDVEN  --  1.2    Estimated Creatinine Clearance: 79.5 mL/min (by C-G formula based on SCr of 0.86 mg/dL).    Allergies  Allergen Reactions  . Phenergan [Promethazine Hcl] Other (See Comments)    Cramping   . Promethazine Other (See Comments)    Cramping  . Ultram [Tramadol] Hives    Antimicrobials this admission: Anti-infectives    Start     Dose/Rate Route Frequency Ordered Stop   04/19/17 2200  piperacillin-tazobactam (ZOSYN) IVPB 3.375 g     3.375 g 12.5 mL/hr over 240 Minutes Intravenous Every 8 hours 04/19/17 1337     04/19/17 2000  vancomycin (VANCOCIN) IVPB 1000 mg/200 mL premix     1,000 mg 200 mL/hr over 60 Minutes Intravenous Every 12 hours 04/19/17 1422     04/19/17 1330  piperacillin-tazobactam (ZOSYN) IVPB 3.375 g     3.375 g 100 mL/hr over 30 Minutes Intravenous  Once 04/19/17 1326 04/19/17 1409   04/19/17 1330  vancomycin (VANCOCIN) IVPB 1000 mg/200 mL premix     1,000 mg 200 mL/hr over 60 Minutes Intravenous  Once 04/19/17 1326       Microbiology results: No results found for this or any previous visit (from the past 240 hour(s)).   Thank you for allowing pharmacy to be a part of this patient's care.  Gerre Pebbles Rory Montel 04/19/2017 2:23 PM

## 2017-04-19 NOTE — Progress Notes (Signed)
Per MD, DC Morphine due to allergic reaction.

## 2017-04-19 NOTE — ED Notes (Signed)
ENT at bedside

## 2017-04-19 NOTE — H&P (Signed)
History and Physical    Cynthia Kramer:[id number] DOB: Aug 10, 1970 DOA: 04/19/2017  Referring physician: Dr. Fanny Bien PCP: Hyman Hopes, MD  Specialists: none  Chief Complaint: left facial swelling and pain  HPI: Cynthia Kramer is a 46 y.o. female has a past medical history significant for HTN and GERD who presents to ER with 1-2 days hx of progressive left facial swelling with redness and pain. No dysphagia or SOB. No issues with speech. Some throat pain. In ER, pt noted to have facial cellulitis. Also hypertensive. ENT scoped patient and found no laryngeal edema. She is now admitted  Review of Systems: The patient denies anorexia, fever, weight loss,, vision loss, decreased hearing, hoarseness, chest pain, syncope, dyspnea on exertion, peripheral edema, balance deficits, hemoptysis, abdominal pain, melena, hematochezia, severe indigestion/heartburn, hematuria, incontinence, genital sores, muscle weakness, suspicious skin lesions, transient blindness, difficulty walking, depression, unusual weight change, abnormal bleeding, enlarged lymph nodes, angioedema, and breast masses.   Past Medical History:  Diagnosis Date  . GERD (gastroesophageal reflux disease)   . Hypertension    Past Surgical History:  Procedure Laterality Date  . ABDOMINAL HYSTERECTOMY    . CHOLECYSTECTOMY    . ESOPHAGOGASTRODUODENOSCOPY (EGD) WITH PROPOFOL N/A 03/23/2017   Procedure: ESOPHAGOGASTRODUODENOSCOPY (EGD) WITH PROPOFOL;  Surgeon: Wyline Mood, MD;  Location: [facility] ENDOSCOPY;  Service: Gastroenterology;  Laterality: N/A;  . HAND SURGERY    . TONSILLECTOMY     Social History:  reports that she quit smoking about 16 months ago. She has never used smokeless tobacco. She reports that she does not drink alcohol or use drugs.  Allergies  Allergen Reactions  . Phenergan [Promethazine Hcl] Other (See Comments)    Cramping   . Promethazine Other (See Comments)    Cramping  . Ultram [Tramadol] Hives     History reviewed. No pertinent family history.  Prior to Admission medications   Medication Sig Start Date End Date Taking? Authorizing Provider  alum & mag hydroxide-simeth (MAALOX MAX) 400-400-40 MG/5ML suspension Take 5 mLs by mouth every 6 (six) hours as needed for indigestion. 03/10/17  Yes Veronese, [place], MD  Cetirizine HCl 10 MG CAPS Take 10 mg by mouth daily.    Yes [provider]  dicyclomine (BENTYL) 10 MG capsule Take 1 capsule (10 mg total) by mouth 4 (four) times daily. 03/23/17 04/19/17 Yes Wyline Mood, MD  fluticasone [facility]) 50 MCG/ACT nasal spray Place 2 sprays into both nostrils daily.   Yes [provider]  hydrochlorothiazide (HYDRODIURIL) 25 MG tablet Take 25 mg by mouth daily.   Yes [provider]  meloxicam (MOBIC) 15 MG tablet Take 15 mg by mouth daily.   Yes [provider]  omeprazole (PRILOSEC) 40 MG capsule Take 1 capsule (40 mg total) by mouth daily. 03/10/17 03/10/18 Yes Don Perking, [place], MD  ondansetron (ZOFRAN) 4 MG tablet Take 1 tablet (4 mg total) by mouth every 8 (eight) hours as needed for nausea or vomiting. 03/17/17  Yes Wyline Mood, MD  oxyCODONE (ROXICODONE) 5 MG immediate release tablet Take 1 tablet (5 mg total) by mouth every 6 (six) hours as needed for breakthrough pain. Do not drive while taking this medication. Patient not taking: Reported on 04/19/2017 11/30/15   Gayla Doss, MD  sucralfate (CARAFATE) 1 g tablet Take 1 tablet (1 g total) by mouth 4 (four) times daily -  with meals and at bedtime. Patient not taking: Reported on 04/19/2017 03/18/17 04/17/17  Wyline Mood, MD   Physical Exam:  Vitals:   04/19/17 1315  BP: (!) 141/85  Pulse: 93  Resp: 16  Temp: 98.5 F (36.9 C)  TempSrc: Oral  SpO2: 95%  Weight: 70.3 kg (155 lb)  Height:  (1.651 m)     General:  No apparent distress, WDWN, [place]/AT  Eyes: PERRL, EOMI, no scleral icterus, conjunctiva clear  ENT: moist oropharynx without  exudate, TM's benign, dentition good  Neck: supple, cervical lymphadenopathy noted. No bruits or thyromegaly  Cardiovascular: regular rate without MRG; 2+ peripheral pulses, no JVD, no peripheral edema  Respiratory: CTA biL, good air movement without wheezing, rhonchi or crackled. Respiratory effort normal  Abdomen: soft, non tender to palpation, positive bowel sounds, no guarding, no rebound  Skin: diffuse swelling and erythema with warmth and tenderness to left face with central pustule noted.  Musculoskeletal: normal bulk and tone, no joint swelling  Psychiatric: normal mood and affect, A&OX3  Neurologic: CN 2-12 grossly intact, Motor strength 5/5 in all 4 groups with symmetric DTR's and non-focal sensory exam  Labs on Admission:  Basic Metabolic Panel:  Recent Labs Lab 04/19/17 1326  NA 137  K 3.9  CL 101  CO2 25  GLUCOSE 109*  BUN 15  CREATININE 0.86  CALCIUM 9.5   Liver Function Tests:  Recent Labs Lab 04/19/17 1326  AST 22  ALT 21  ALKPHOS 109  BILITOT 1.8*  PROT 8.0  ALBUMIN 4.3   No results for input(s): LIPASE, AMYLASE in the last 168 hours. No results for input(s): AMMONIA in the last 168 hours. CBC:  Recent Labs Lab 04/19/17 1326  WBC 13.7*  NEUTROABS 9.9*  HGB 14.6  HCT 41.9  MCV 88.6  PLT 278   Cardiac Enzymes: No results for input(s): CKTOTAL, CKMB, CKMBINDEX, TROPONINI in the last 168 hours.  BNP (last 3 results) No results for input(s): BNP in the last 8760 hours.  ProBNP (last 3 results) No results for input(s): PROBNP in the last 8760 hours.  CBG: No results for input(s): GLUCAP in the last 168 hours.  Radiological Exams on Admission: No results found.  EKG: Independently reviewed.  Assessment/Plan Principal Problem:   Cellulitis of face Active Problems:   GERD without esophagitis   Hypertension   Sore throat   Will admit to floor with IV fluid, IV ABX, and IV steroids. Clear liquid diet. Watch closely for signs  of laryngeal edema. ENT to follow. Optimize BP regimen. Repeat labs in AM.  Diet: clear liquids Fluids: NS@100  DVT Prophylaxis: SQ Heparin  Code Status: FULL  Family Communication: none  Disposition Plan: home  Time spent: 50 min

## 2017-04-19 NOTE — ED Triage Notes (Signed)
Pt to ED reporting having had a pimple like mass on left side of face. Pt reports having gone to bed and woke up this afternoon with left sided facial swelling. No difficulty swallowing or SOB reported. Pt denies feeling as though her throat is swelling.

## 2017-04-20 LAB — CBC
HEMATOCRIT: 41.4 % (ref 35.0–47.0)
HEMOGLOBIN: 13.9 g/dL (ref 12.0–16.0)
MCH: 30 pg (ref 26.0–34.0)
MCHC: 33.6 g/dL (ref 32.0–36.0)
MCV: 89.2 fL (ref 80.0–100.0)
Platelets: 249 10*3/uL (ref 150–440)
RBC: 4.64 MIL/uL (ref 3.80–5.20)
RDW: 12.8 % (ref 11.5–14.5)
WBC: 16.9 10*3/uL — ABNORMAL HIGH (ref 3.6–11.0)

## 2017-04-20 LAB — COMPREHENSIVE METABOLIC PANEL
ALBUMIN: 3.8 g/dL (ref 3.5–5.0)
ALT: 35 U/L (ref 14–54)
ANION GAP: 11 (ref 5–15)
AST: 34 U/L (ref 15–41)
Alkaline Phosphatase: 115 U/L (ref 38–126)
BUN: 11 mg/dL (ref 6–20)
CHLORIDE: 104 mmol/L (ref 101–111)
CO2: 23 mmol/L (ref 22–32)
Calcium: 9.4 mg/dL (ref 8.9–10.3)
Creatinine, Ser: 0.76 mg/dL (ref 0.44–1.00)
GFR calc non Af Amer: >60 mL/min (ref >60)
Glucose, Bld: 167 mg/dL — ABNORMAL HIGH (ref 65–99)
POTASSIUM: 4.1 mmol/L (ref 3.5–5.1)
SODIUM: 138 mmol/L (ref 135–145)
Total Bilirubin: 1.1 mg/dL (ref 0.3–1.2)
Total Protein: 7.5 g/dL (ref 6.5–8.1)

## 2017-04-20 LAB — GLUCOSE, CAPILLARY: GLUCOSE-CAPILLARY: 136 mg/dL — AB (ref 65–99)

## 2017-04-20 MED ORDER — MUPIROCIN 2 % EX OINT
TOPICAL_OINTMENT | Freq: Three times a day (TID) | CUTANEOUS | Status: DC
  Filled 2017-04-20: qty 22

## 2017-04-20 MED ORDER — CLINDAMYCIN PHOSPHATE 300 MG/50ML IV SOLN
300.0000 mg | Freq: Three times a day (TID) | INTRAVENOUS | Status: DC
  Administered 2017-04-20: 300 mg via INTRAVENOUS
  Filled 2017-04-20 (×4): qty 50

## 2017-04-20 MED ORDER — OXYCODONE-ACETAMINOPHEN 5-325 MG PO TABS
1.0000 | ORAL_TABLET | ORAL | Status: DC | PRN
  Administered 2017-04-20 (×2): 2 via ORAL
  Filled 2017-04-20 (×2): qty 2

## 2017-04-20 MED ORDER — OXYCODONE-ACETAMINOPHEN 7.5-325 MG PO TABS
1.0000 | ORAL_TABLET | ORAL | Status: DC | PRN
  Administered 2017-04-20 – 2017-04-21 (×3): 2 via ORAL
  Filled 2017-04-20 (×3): qty 2

## 2017-04-20 MED ORDER — OXYCODONE-ACETAMINOPHEN 5-325 MG PO TABS
2.0000 | ORAL_TABLET | Freq: Four times a day (QID) | ORAL | Status: DC | PRN
  Administered 2017-04-20: 2 via ORAL
  Filled 2017-04-20: qty 2

## 2017-04-20 MED ORDER — CHLORHEXIDINE GLUCONATE CLOTH 2 % EX PADS
6.0000 | MEDICATED_PAD | Freq: Every day | CUTANEOUS | Status: DC
  Administered 2017-04-20 – 2017-04-21 (×2): 6 via TOPICAL

## 2017-04-20 MED ORDER — MUPIROCIN 2 % EX OINT
1.0000 "application " | TOPICAL_OINTMENT | Freq: Two times a day (BID) | CUTANEOUS | Status: DC
  Administered 2017-04-20 – 2017-04-21 (×3): 1 via NASAL
  Filled 2017-04-20: qty 22

## 2017-04-20 NOTE — Progress Notes (Signed)
[facility] - [place] [facility]   PATIENT NAME: Cynthia Kramer    MR#:  [id number]  DATE OF BIRTH:  [birth year]  SUBJECTIVE:  Left facial redness better. Some pain. Eye now opening Pt feels hungry  REVIEW OF SYSTEMS:   Review of Systems  Constitutional: Negative for chills, fever and weight loss.  HENT: Negative for ear discharge, ear pain and nosebleeds.   Eyes: Negative for blurred vision, pain and discharge.  Respiratory: Negative for sputum production, shortness of breath, wheezing and stridor.   Cardiovascular: Negative for chest pain, palpitations, orthopnea and PND.  Gastrointestinal: Negative for abdominal pain, diarrhea, nausea and vomiting.  Genitourinary: Negative for frequency and urgency.  Musculoskeletal: Negative for back pain and joint pain.  Neurological: Negative for sensory change, speech change, focal weakness and weakness.  Psychiatric/Behavioral: Negative for depression and hallucinations. The patient is not nervous/anxious.    Tolerating Diet: regular Tolerating PT: ambulatory  DRUG ALLERGIES:   Allergies  Allergen Reactions  . Phenergan [Promethazine Hcl] Other (See Comments)    Cramping   . Promethazine Other (See Comments)    Cramping  . Ultram [Tramadol] Hives    VITALS:  Blood pressure 119/69, pulse (!) 57, temperature (!) 97.3 F (36.3 C), temperature source Oral, resp. rate 18, height  (1.651 m), weight 70.3 kg (155 lb), SpO2 97 %.  PHYSICAL EXAMINATION:   Physical Exam  GENERAL:  46 y.o.-year-old patient lying in the bed with no acute distress.  EYES: Pupils equal, round, reactive to light and accommodation. No scleral icterus. Extraocular muscles intact. Patient able to open eyes today. HEENT: Head atraumatic, normocephalic. Oropharynx and nasopharynx clear. Left facial swelling and redness improving. NECK:  Supple, no jugular venous distention. No thyroid enlargement, no tenderness.  LUNGS: Normal breath  sounds bilaterally, no wheezing, rales, rhonchi. No use of accessory muscles of respiration.  CARDIOVASCULAR: S1, S2 normal. No murmurs, rubs, or gallops.  ABDOMEN: Soft, nontender, nondistended. Bowel sounds present. No organomegaly or mass.  EXTREMITIES: No cyanosis, clubbing or edema b/l.    NEUROLOGIC: Cranial nerves II through XII are intact. No focal Motor or sensory deficits b/l.   PSYCHIATRIC:  patient is alert and oriented x 3.  SKIN: No obvious rash, lesion, or ulcer.   LABORATORY PANEL:  CBC  Recent Labs Lab 04/20/17 0403  WBC 16.9*  HGB 13.9  HCT 41.4  PLT 249    Chemistries   Recent Labs Lab 04/20/17 0403  NA 138  K 4.1  CL 104  CO2 23  GLUCOSE 167*  BUN 11  CREATININE 0.76  CALCIUM 9.4  AST 34  ALT 35  ALKPHOS 115  BILITOT 1.1   Cardiac Enzymes No results for input(s): TROPONINI in the last 168 hours. RADIOLOGY:  Ct Maxillofacial W Contrast  Result Date: 04/19/2017 CLINICAL DATA:  Maxillofacial swelling. EXAM: CT MAXILLOFACIAL WITH CONTRAST TECHNIQUE: Multidetector CT imaging of the maxillofacial structures was performed with intravenous contrast. Multiplanar CT image reconstructions were also generated. CONTRAST:  75mL ISOVUE-300 IOPAMIDOL (ISOVUE-300) INJECTION 61% COMPARISON:  Head CT 11/17/2015 FINDINGS: Osseous: No destructive process or fracture. Orbits: There is extensive cellulitic change superficial to the left orbit and tracking into the left face. Reportedly there was an offending skin pustule. No soft tissue emphysema. No postseptal inflammatory changes. Sinuses: Mild mucosal thickening in the paranasal sinuses. No sinus fluid levels. Mucosal thickening narrows the left maxillary infundibulum. Soft tissues: Left face cellulitis as noted above. Negative for abscess. No periapical erosions of  the left-sided teeth. Reactive pattern enlargement of submandibular and submental lymph nodes. Limited intracranial: Negative IMPRESSION: Left facial  cellulitis.  No postseptal inflammation or abscess. Electronically Signed   By: Marnee Spring M.D.   On: 04/19/2017 14:46   ASSESSMENT AND PLAN:  BRINDA FOCHT is a 46 y.o. female has a past medical history significant for HTN and GERD who presents to ER with 1-2 days hx of progressive left facial swelling with redness and pain.  1. Left facial cellulitis -IV vancomycin and Zosyn. Patient is MRSA positive -Blood cultures remain negative we'll change to by mouth Bactrim tomorrow -Patient had a small pustule on the left Nare, no discharge noted. -White count 16,000 -Blood cultures negative -CT maxillofacial no abscess -When necessary Percocet  2. Hypertension -Resume home meds  3. Leukocytosis -Due to #1  4. DVT prophylaxis ambulatory   Case discussed with Care Management/Social Worker. Management plans discussed with the patient, family and they are in agreement.  CODE STATUS: full  DVT Prophylaxis: lovenox  TOTAL TIME TAKING CARE OF THIS PATIENT: 30 minutes.  >50% time spent on counselling and coordination of care  POSSIBLE D/C IN *1-2* DAYS, DEPENDING ON CLINICAL CONDITION.  Note: This dictation was prepared with Dragon dictation along with smaller phrase technology. Any transcriptional errors that result from this process are unintentional.  Mirela Parsley M.D on 04/20/2017 at 9:44 AM  Between 7am to 6pm - Pager - [phone]  After 6pm go to [email or web address] - Social research officer, government  [facility]  Office  [phone]  CC: Primary care physician; Hyman Hopes, MDPatient ID: Cathlean Cower, female   DOB: 04-May-1971, 45 y.o.   MRN: [id number]

## 2017-04-21 LAB — VANCOMYCIN, TROUGH: Vancomycin Tr: 12 ug/mL — ABNORMAL LOW (ref 15–20)

## 2017-04-21 LAB — HIV ANTIBODY (ROUTINE TESTING W REFLEX): HIV SCREEN 4TH GENERATION: NONREACTIVE

## 2017-04-21 LAB — GLUCOSE, CAPILLARY: Glucose-Capillary: 99 mg/dL (ref 65–99)

## 2017-04-21 MED ORDER — OXYCODONE-ACETAMINOPHEN 5-325 MG PO TABS: 1.0000 | ORAL_TABLET | ORAL | 0 refills | Status: DC | PRN

## 2017-04-21 MED ORDER — MUPIROCIN 2 % EX OINT: 1.0000 "application " | TOPICAL_OINTMENT | Freq: Two times a day (BID) | CUTANEOUS | 0 refills | Status: AC

## 2017-04-21 MED ORDER — SULFAMETHOXAZOLE-TRIMETHOPRIM 800-160 MG PO TABS: 1.0000 | ORAL_TABLET | Freq: Two times a day (BID) | ORAL | 0 refills | Status: DC

## 2017-04-21 MED ORDER — OXYCODONE-ACETAMINOPHEN 5-325 MG PO TABS
1.0000 | ORAL_TABLET | ORAL | Status: DC | PRN
  Administered 2017-04-21: 2 via ORAL
  Filled 2017-04-21: qty 2

## 2017-04-21 MED ORDER — SULFAMETHOXAZOLE-TRIMETHOPRIM 800-160 MG PO TABS
1.0000 | ORAL_TABLET | Freq: Two times a day (BID) | ORAL | Status: DC
  Administered 2017-04-21: 1 via ORAL
  Filled 2017-04-21: qty 1

## 2017-04-21 NOTE — Progress Notes (Signed)
Received call from central tele regarding change in patient's rhythm.  Between 03:03-03:20 several extra P waves were noted, causing monitor; [name], to record activity as a second degree block, but no further abnormalities noted.  Rhythm back to baseline of Sinus Cynthia Kramer.  RN reported change to on call, Dr. Tobi Bastos.  No directives given for changes to current plan of care.

## 2017-04-21 NOTE — Discharge Summary (Signed)
[facility] [place] [facility]   PATIENT NAME: Cynthia Kramer    MR#:  [id number]  DATE OF BIRTH:  1970-10-27  DATE OF ADMISSION:  04/19/2017 ADMITTING PHYSICIAN: Marguarite Arbour, MD  DATE OF DISCHARGE: 04/21/17  PRIMARY CARE PHYSICIAN: Hyman Hopes, MD    ADMISSION DIAGNOSIS:  Cellulitis, face [L03.211] Swelling of left side of face [R22.0]  DISCHARGE DIAGNOSIS:  Left facial cellulitis  SECONDARY DIAGNOSIS:   Past Medical History:  Diagnosis Date  . GERD (gastroesophageal reflux disease)   . Hypertension     HOSPITAL COURSE:   [name] Stoutis a 46 y.o.femalehas a past medical history significant for HTN and GERD who presents to ER with 1-2 days hx of progressive left facial swelling with redness and pain.  1. Left facial cellulitis -IV vancomycin and Zosyn---change to oral bactrim - Patient is MRSA positive--cont nasal bactroban -Blood cultures remain negative, afebrile -Patient had a small pustule on the left Nare, no discharge noted. -CT maxillofacial no abscess, shows cellulitis -When necessary Percocet  2. Hypertension -Resume home meds--HCTZ -no indication for BB -HR 62  3. Leukocytosis -Due to #1  4. DVT prophylaxis ambulatory  Pt evidently has been asking for increasing amount of pain meds. Per RN, pt's mother called ans said she has taken away her father's pain meds (oxycodone--?qty) Pt is irritable when I told her overall improvement. She wants to not go home and continue getting antibiotics and pain meds which at this pt is clinically not indicated. Since she was upset lot of cuss words used this am by pt. RN notified.  CONSULTS OBTAINED:    DRUG ALLERGIES:   Allergies  Allergen Reactions  . Phenergan [Promethazine Hcl] Other (See Comments)    Cramping   . Promethazine Other (See Comments)    Cramping  . Ultram [Tramadol] Hives    DISCHARGE MEDICATIONS:   Current Discharge Medication List     START taking these medications   Details  mupirocin ointment (BACTROBAN) 2 % Place 1 application into the nose 2 (two) times daily. Qty: 22 g, Refills: 0    oxyCODONE-acetaminophen (PERCOCET/ROXICET) 5-325 MG tablet Take 1-2 tablets by mouth every 4 (four) hours as needed for moderate pain or severe pain. DO not drive when taking this medication Qty: 20 tablet, Refills: 0    sulfamethoxazole-trimethoprim (BACTRIM DS,SEPTRA DS) 800-160 MG tablet Take 1 tablet by mouth every 12 (twelve) hours. Qty: 20 tablet, Refills: 0      CONTINUE these medications which have NOT CHANGED   Details  alum & mag hydroxide-simeth (MAALOX MAX) 400-400-40 MG/5ML suspension Take 5 mLs by mouth every 6 (six) hours as needed for indigestion. Qty: 355 mL, Refills: 0    Cetirizine HCl 10 MG CAPS Take 10 mg by mouth daily.     fluticasone (FLONASE) 50 MCG/ACT nasal spray Place 2 sprays into both nostrils daily.    hydrochlorothiazide (HYDRODIURIL) 25 MG tablet Take 25 mg by mouth daily.    meloxicam (MOBIC) 15 MG tablet Take 15 mg by mouth daily.    omeprazole (PRILOSEC) 40 MG capsule Take 1 capsule (40 mg total) by mouth daily. Qty: 30 capsule, Refills: 1    ondansetron (ZOFRAN) 4 MG tablet Take 1 tablet (4 mg total) by mouth every 8 (eight) hours as needed for nausea or vomiting. Qty: 30 tablet, Refills: 0      STOP taking these medications     dicyclomine (BENTYL) 10 MG capsule  oxyCODONE (ROXICODONE) 5 MG immediate release tablet      sucralfate (CARAFATE) 1 g tablet         If you experience worsening of your admission symptoms, develop shortness of breath, life threatening emergency, suicidal or homicidal thoughts you must seek medical attention immediately by calling 911 or calling your MD immediately  if symptoms less severe.  You Must read complete instructions/literature along with all the possible adverse reactions/side effects for all the Medicines you take and that have been  prescribed to you. Take any new Medicines after you have completely understood and accept all the possible adverse reactions/side effects.   Please note  You were cared for by a hospitalist during your hospital stay. If you have any questions about your discharge medications or the care you received while you were in the hospital after you are discharged, you can call the unit and asked to speak with the hospitalist on call if the hospitalist that took care of you is not available. Once you are discharged, your primary care physician will handle any further medical issues. Please note that NO REFILLS for any discharge medications will be authorized once you are discharged, as it is imperative that you return to your primary care physician (or establish a relationship with a primary care physician if you do not have one) for your aftercare needs so that they can reassess your need for medications and monitor your lab values. Today   SUBJECTIVE   headache  VITAL SIGNS:  Blood pressure 109/70, pulse (!) 51, temperature 97.9 F (36.6 C), temperature source Oral, resp. rate 16, height  (1.651 m), weight 75.3 kg (165 lb 14.4 oz), SpO2 100 %.  I/O:   Intake/Output Summary (Last 24 hours) at 04/21/17 0837 Last data filed at 04/20/17 2254  Gross per 24 hour  Intake             1333 ml  Output                0 ml  Net             1333 ml    PHYSICAL EXAMINATION:  GENERAL:  46 y.o.-year-old patient lying in the bed with no acute distress.  EYES: Pupils equal, round, reactive to light and accommodation. No scleral icterus. Extraocular muscles intact.Able to ope left eye completely  HEENT: Head atraumatic, normocephalic. Oropharynx and nasopharynx clear. Left facial cellulitis improved. No abscess. Mild induration around the left nare pustule (dried already) NECK:  Supple, no jugular venous distention. No thyroid enlargement, no tenderness.  LUNGS: Normal breath sounds bilaterally, no wheezing,  rales,rhonchi or crepitation. No use of accessory muscles of respiration.  CARDIOVASCULAR: S1, S2 normal. No murmurs, rubs, or gallops.  ABDOMEN: Soft, non-tender, non-distended. Bowel sounds present. No organomegaly or mass.  EXTREMITIES: No pedal edema, cyanosis, or clubbing.  NEUROLOGIC: Cranial nerves II through XII are intact. Muscle strength 5/5 in all extremities. Sensation intact. Gait not checked.  PSYCHIATRIC: The patient is alert and oriented x 3.  SKIN: No obvious rash, lesion, or ulcer.   DATA REVIEW:   CBC   Recent Labs Lab 04/20/17 0403  WBC 16.9*  HGB 13.9  HCT 41.4  PLT 249    Chemistries   Recent Labs Lab 04/20/17 0403  NA 138  K 4.1  CL 104  CO2 23  GLUCOSE 167*  BUN 11  CREATININE 0.76  CALCIUM 9.4  AST 34  ALT 35  ALKPHOS 115  BILITOT  1.1    Microbiology Results   Recent Results (from the past 240 hour(s))  Blood Culture (routine x 2)     Status: None (Preliminary result)   Collection Time: 04/19/17  1:26 PM  Result Value Ref Range Status   Specimen Description BLOOD RIGHT ARM  Final   Special Requests   Final    BOTTLES DRAWN AEROBIC AND ANAEROBIC Blood Culture results may not be optimal due to an excessive volume of blood received in culture bottles   Culture NO GROWTH 2 DAYS  Final   Report Status PENDING  Incomplete  Blood Culture (routine x 2)     Status: None (Preliminary result)   Collection Time: 04/19/17  1:31 PM  Result Value Ref Range Status   Specimen Description BLOOD RIGHT ARM  Final   Special Requests   Final    BOTTLES DRAWN AEROBIC AND ANAEROBIC Blood Culture adequate volume   Culture NO GROWTH 2 DAYS  Final   Report Status PENDING  Incomplete  MRSA PCR Screening     Status: Abnormal   Collection Time: 04/19/17  5:13 PM  Result Value Ref Range Status   MRSA by PCR POSITIVE (A) NEGATIVE Final    Comment:        The GeneXpert MRSA Assay (FDA approved for NASAL specimens only), is one component of a comprehensive  MRSA colonization surveillance program. It is not intended to diagnose MRSA infection nor to guide or monitor treatment for MRSA infections. RESULT CALLED TO, READ BACK BY AND VERIFIED WITH: Barney Drain RN AT 1610 04/19/17. MSS     RADIOLOGY:  Ct Maxillofacial W Contrast  Result Date: 04/19/2017 CLINICAL DATA:  Maxillofacial swelling. EXAM: CT MAXILLOFACIAL WITH CONTRAST TECHNIQUE: Multidetector CT imaging of the maxillofacial structures was performed with intravenous contrast. Multiplanar CT image reconstructions were also generated. CONTRAST:  75mL ISOVUE-300 IOPAMIDOL (ISOVUE-300) INJECTION 61% COMPARISON:  Head CT 11/17/2015 FINDINGS: Osseous: No destructive process or fracture. Orbits: There is extensive cellulitic change superficial to the left orbit and tracking into the left face. Reportedly there was an offending skin pustule. No soft tissue emphysema. No postseptal inflammatory changes. Sinuses: Mild mucosal thickening in the paranasal sinuses. No sinus fluid levels. Mucosal thickening narrows the left maxillary infundibulum. Soft tissues: Left face cellulitis as noted above. Negative for abscess. No periapical erosions of the left-sided teeth. Reactive pattern enlargement of submandibular and submental lymph nodes. Limited intracranial: Negative IMPRESSION: Left facial cellulitis.  No postseptal inflammation or abscess. Electronically Signed   By: Marnee Spring M.D.   On: 04/19/2017 14:46     Management plans discussed with the patient, family and they are in agreement.  CODE STATUS:     Code Status Orders        Start     Ordered   04/19/17 1629  Full code  Continuous     04/19/17 1628    Code Status History    Date Active Date Inactive Code Status Order ID Comments User Context   This patient has a current code status but no historical code status.    Advance Directive Documentation     Most Recent Value  Type of Advance Directive  Healthcare Power of Attorney,  Living will  Pre-existing out of facility DNR order (yellow form or pink MOST form)  -  "MOST" Form in Place?  -      TOTAL TIME TAKING CARE OF THIS PATIENT: *40* minutes.    Scottlyn Mchaney M.D on 04/21/2017 at 8:37 AM  Between 7am to 6pm - Pager - [phone] After 6pm go to [email or web address] - Social research officer, government  [facility]  Office  [phone]  CC: Primary care physician; Hyman Hopes, MD

## 2017-04-21 NOTE — Progress Notes (Signed)
Cynthia Kramer  A and O x 4. VSS. Pt tolerating diet well. No complaints of pain or nausea. IV removed intact, prescriptions given. Pt voiced understanding of discharge instructions with no further questions. Pt discharged via wheelchair with nurse tech.     Allergies as of 04/21/2017      Reactions   Phenergan [promethazine Hcl] Other (See Comments)   Cramping   Promethazine Other (See Comments)   Cramping   Ultram [tramadol] Hives      Medication List    STOP taking these medications   dicyclomine 10 MG capsule Commonly known as:  BENTYL   oxyCODONE 5 MG immediate release tablet Commonly known as:  ROXICODONE   sucralfate 1 g tablet Commonly known as:  CARAFATE     TAKE these medications   alum & mag hydroxide-simeth 400-400-40 MG/5ML suspension Commonly known as:  MAALOX MAX Take 5 mLs by mouth every 6 (six) hours as needed for indigestion.   Cetirizine HCl 10 MG Caps Take 10 mg by mouth daily.   fluticasone 50 MCG/ACT nasal spray Commonly known as:  FLONASE Place 2 sprays into both nostrils daily.   hydrochlorothiazide 25 MG tablet Commonly known as:  HYDRODIURIL Take 25 mg by mouth daily.   meloxicam 15 MG tablet Commonly known as:  MOBIC Take 15 mg by mouth daily.   mupirocin ointment 2 % Commonly known as:  BACTROBAN Place 1 application into the nose 2 (two) times daily.   omeprazole 40 MG capsule Commonly known as:  PRILOSEC Take 1 capsule (40 mg total) by mouth daily.   ondansetron 4 MG tablet Commonly known as:  ZOFRAN Take 1 tablet (4 mg total) by mouth every 8 (eight) hours as needed for nausea or vomiting.   oxyCODONE-acetaminophen 5-325 MG tablet Commonly known as:  PERCOCET/ROXICET Take 1-2 tablets by mouth every 4 (four) hours as needed for moderate pain or severe pain. DO not drive when taking this medication   sulfamethoxazole-trimethoprim 800-160 MG tablet Commonly known as:  BACTRIM DS,SEPTRA DS Take 1 tablet by mouth every 12 (twelve)  hours.       Vitals:   04/21/17 0514 04/21/17 0939  BP: 109/70 119/89  Pulse: (!) 51 63  Resp: 16   Temp: 97.9 F (36.6 C)   SpO2: 100%     Suzzanne Cloud

## 2017-04-21 NOTE — Discharge Instructions (Signed)

## 2017-04-21 NOTE — Care Management (Signed)
Patient provided with application to Medication Management, [facility], and "[employer]:  Your Guide to Free and [employer] in [facility]"  Booklet Prior to discharge

## 2017-04-24 LAB — CULTURE, BLOOD (ROUTINE X 2)
CULTURE: NO GROWTH
Culture: NO GROWTH
SPECIAL REQUESTS: ADEQUATE

## 2018-01-30 IMAGING — CT CT MAXILLOFACIAL W/ CM
3 of 4 series · 16 of 47 positions shown, 19 images · IV contrast (iopamidol)
Comparison: Head CT 11/17/2015

CLINICAL DATA: Maxillofacial swelling.

EXAM:
CT MAXILLOFACIAL WITH CONTRAST
TECHNIQUE: Multidetector CT imaging of the maxillofacial structures was
performed with intravenous contrast. Multiplanar CT image
reconstructions were also generated.
CONTRAST:  75mL 8D9PUB-V88 IOPAMIDOL (8D9PUB-V88) INJECTION 61%

[Series 2: max soft · axial · 0.34mm/px · z∈[-219,-87]mm · 12 of 78 slices shown, 15 images]
[im 6/78  brain]
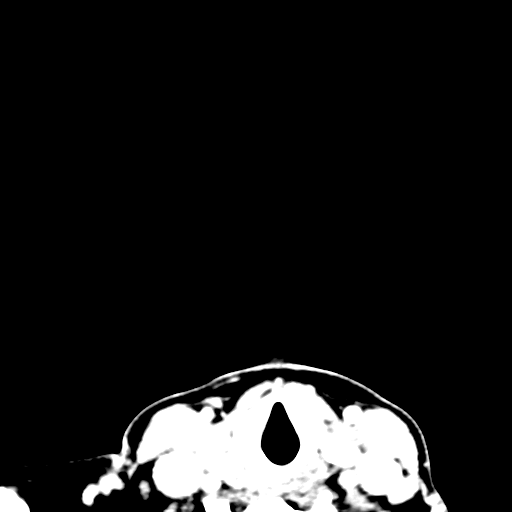
[im 6/78  bone]
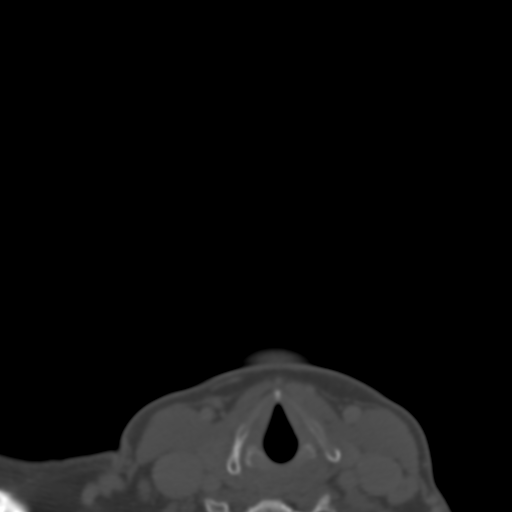
[im 11/78  bone]
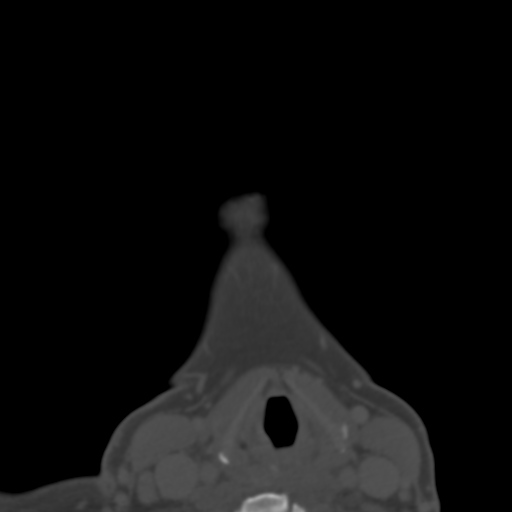
[im 16/78  bone]
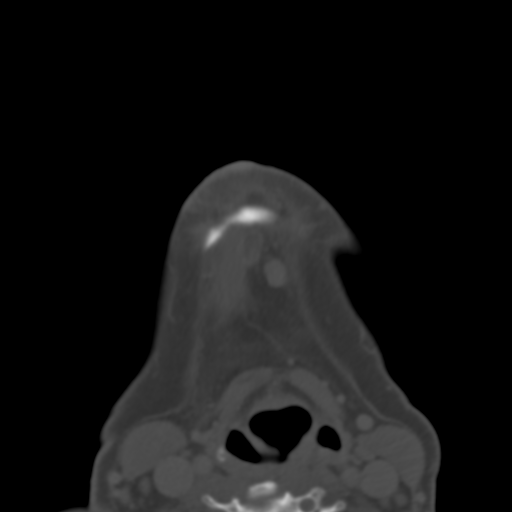
[im 24/78  bone]
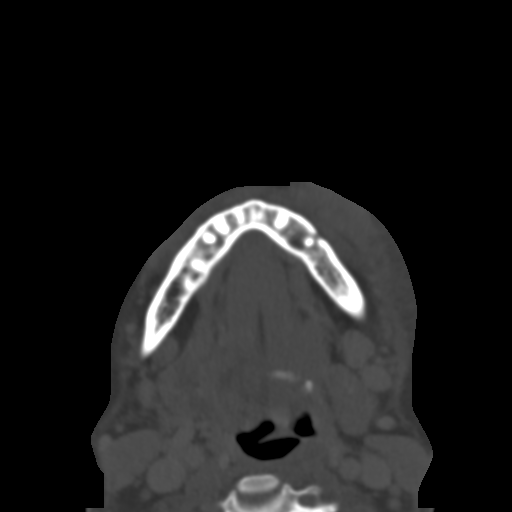
[im 30/78  brain]
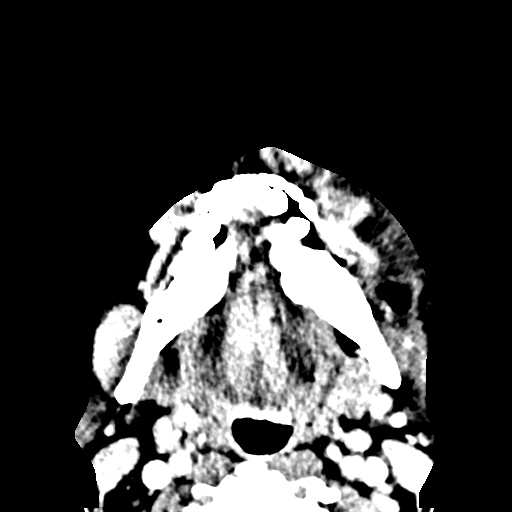
[im 30/78  bone]
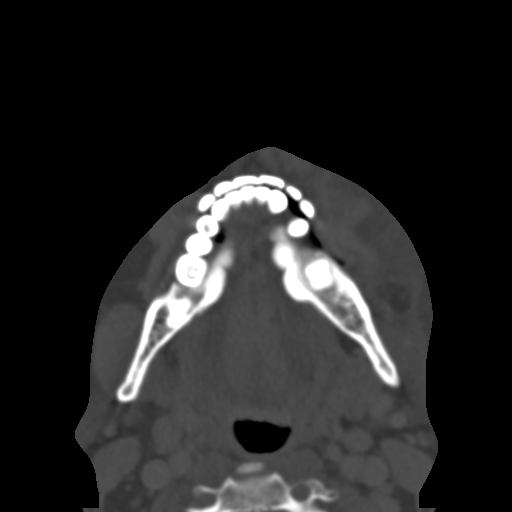
[im 35/78  bone]
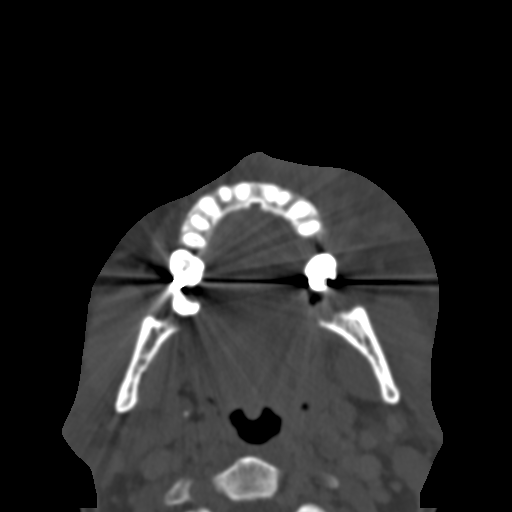
[im 43/78  bone]
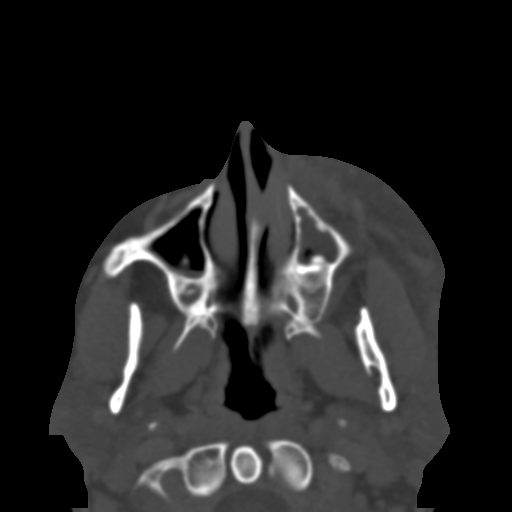
[im 48/78  bone]
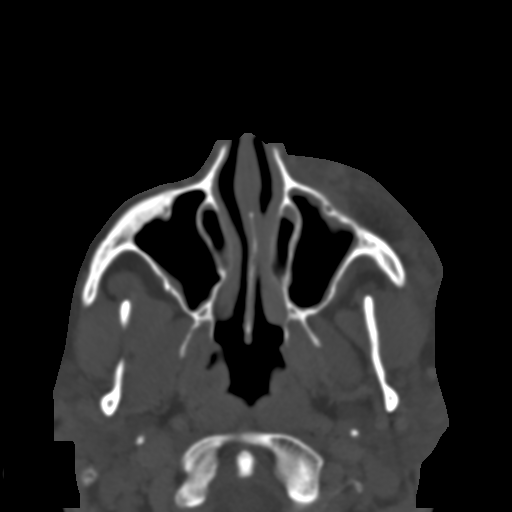
[im 54/78  brain]
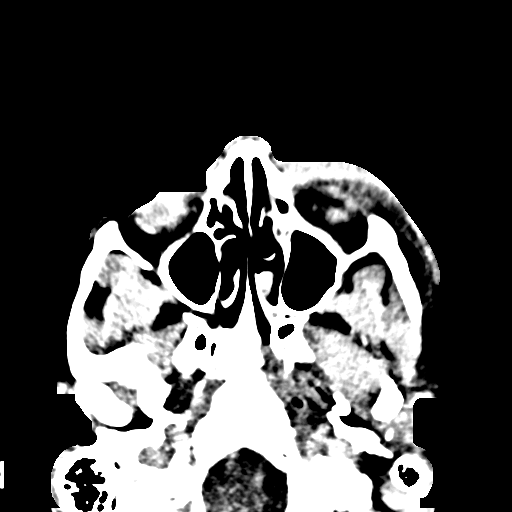
[im 54/78  bone]
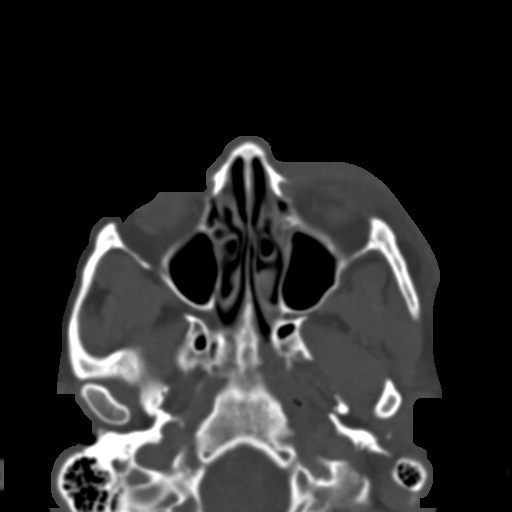
[im 62/78  bone]
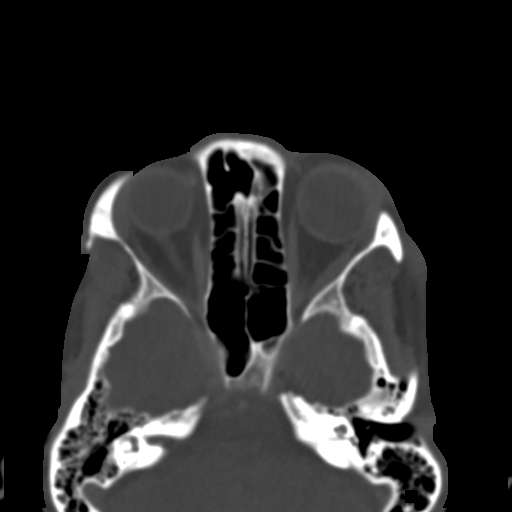
[im 67/78  bone]
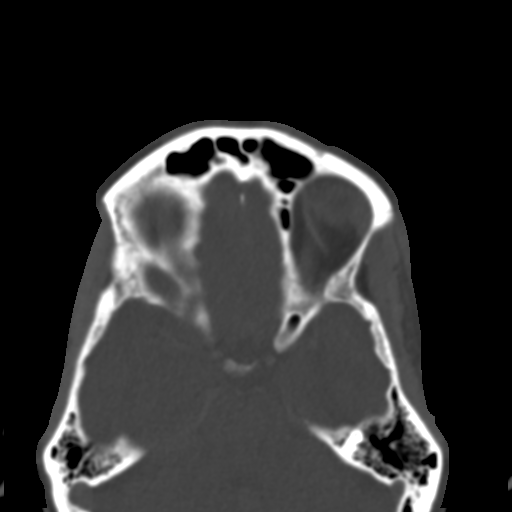
[im 72/78  bone]
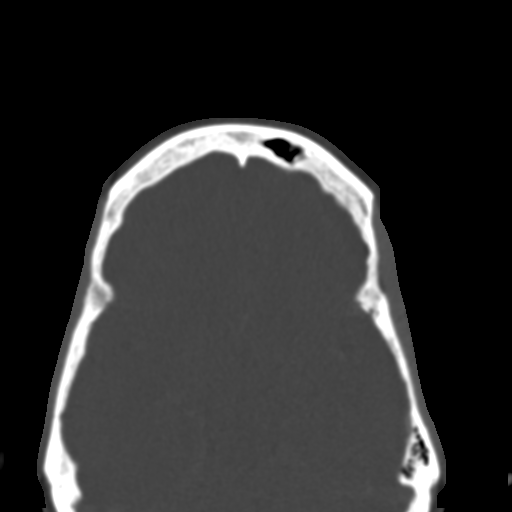

[Series 6: coronal soft · coronal · 0.33mm/px · 3 of 73 slices shown]
[im 25/73  bone]
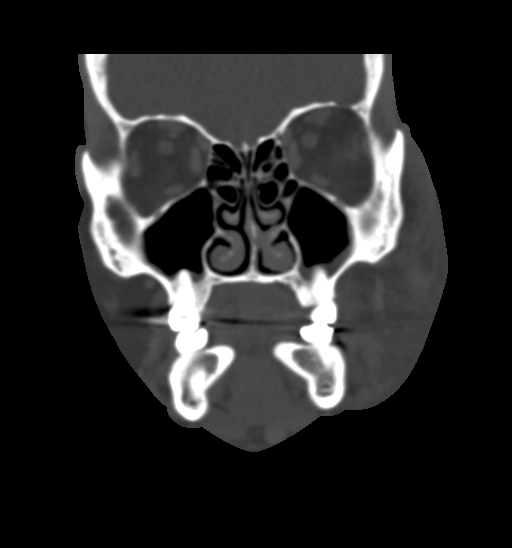
[im 33/73  bone]
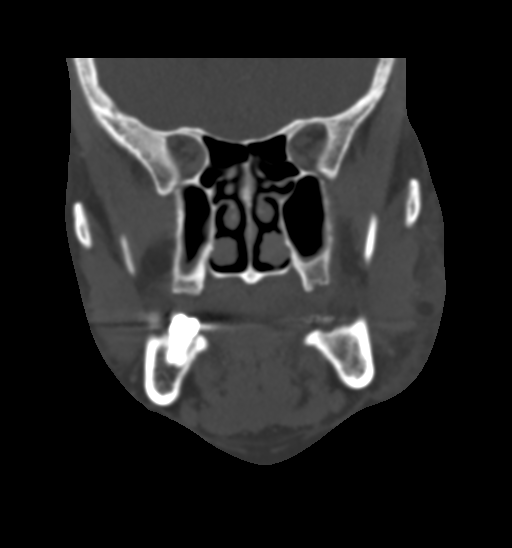
[im 41/73  bone]
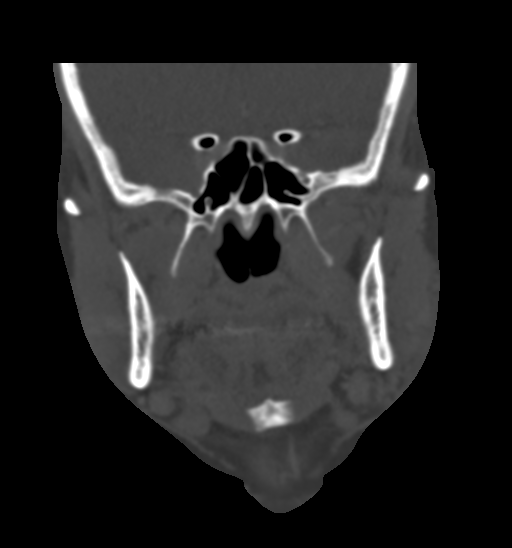

[Series 9: sagittal bone · sagittal · 0.30mm/px · 1 of 68 slices shown]
[im 34/68  bone]
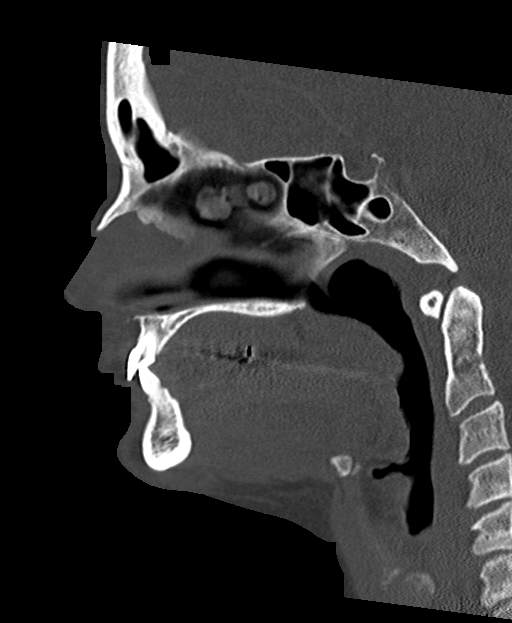

[16 of 47 positions shown; findings below may reference images not displayed]

FINDINGS: Osseous: No destructive process or fracture.

Orbits: There is extensive cellulitic change superficial to the left
orbit and tracking into the left face. Reportedly there was an
offending skin pustule. No soft tissue emphysema. No postseptal
inflammatory changes.

Sinuses: Mild mucosal thickening in the paranasal sinuses. No sinus
fluid levels. Mucosal thickening narrows the left maxillary
infundibulum.

Soft tissues: Left face cellulitis as noted above. Negative for
abscess. No periapical erosions of the left-sided teeth. Reactive
pattern enlargement of submandibular and submental lymph nodes.

Limited intracranial: Negative
IMPRESSION: Left facial cellulitis.  No postseptal inflammation or abscess.

## 2018-08-31 ENCOUNTER — Encounter: Payer: Self-pay | Admitting: Emergency Medicine

## 2018-08-31 ENCOUNTER — Emergency Department: Payer: Self-pay

## 2018-08-31 ENCOUNTER — Other Ambulatory Visit: Payer: Self-pay

## 2018-08-31 ENCOUNTER — Emergency Department
Admission: EM | Admit: 2018-08-31 | Discharge: 2018-08-31 | Disposition: A | Discharge status: Home / Self Care | Payer: Self-pay | Attending: Emergency Medicine | Admitting: Emergency Medicine

## 2018-08-31 DIAGNOSIS — Y929 Unspecified place or not applicable: Secondary | ICD-10-CM | POA: Insufficient documentation

## 2018-08-31 DIAGNOSIS — W1789XA Other fall from one level to another, initial encounter: Secondary | ICD-10-CM | POA: Insufficient documentation

## 2018-08-31 DIAGNOSIS — S638X1A Sprain of other part of right wrist and hand, initial encounter: Secondary | ICD-10-CM | POA: Insufficient documentation

## 2018-08-31 DIAGNOSIS — Z87891 Personal history of nicotine dependence: Secondary | ICD-10-CM | POA: Insufficient documentation

## 2018-08-31 DIAGNOSIS — Y999 Unspecified external cause status: Secondary | ICD-10-CM | POA: Insufficient documentation

## 2018-08-31 DIAGNOSIS — Z79899 Other long term (current) drug therapy: Secondary | ICD-10-CM | POA: Insufficient documentation

## 2018-08-31 DIAGNOSIS — I10 Essential (primary) hypertension: Secondary | ICD-10-CM | POA: Insufficient documentation

## 2018-08-31 DIAGNOSIS — S6991XA Unspecified injury of right wrist, hand and finger(s), initial encounter: Secondary | ICD-10-CM

## 2018-08-31 DIAGNOSIS — Y939 Activity, unspecified: Secondary | ICD-10-CM | POA: Insufficient documentation

## 2018-08-31 MED ORDER — FENTANYL CITRATE (PF) 100 MCG/2ML IJ SOLN
100.0000 ug | Freq: Once | INTRAMUSCULAR | Status: AC
  Administered 2018-08-31: 100 ug via INTRAMUSCULAR

## 2018-08-31 MED ORDER — OXYCODONE HCL 5 MG PO TABS
5.0000 mg | ORAL_TABLET | Freq: Once | ORAL | Status: AC
  Administered 2018-08-31: 5 mg via ORAL
  Filled 2018-08-31: qty 1

## 2018-08-31 MED ORDER — FENTANYL CITRATE (PF) 100 MCG/2ML IJ SOLN
INTRAMUSCULAR | Status: AC
  Administered 2018-08-31: 100 ug via INTRAMUSCULAR
  Filled 2018-08-31: qty 2

## 2018-08-31 MED ORDER — OXYCODONE-ACETAMINOPHEN 10-325 MG PO TABS: 1.0000 | ORAL_TABLET | Freq: Four times a day (QID) | ORAL | 0 refills | Status: AC | PRN

## 2018-08-31 MED ORDER — DIAZEPAM 2 MG PO TABS
2.0000 mg | ORAL_TABLET | Freq: Once | ORAL | Status: AC
  Administered 2018-08-31: 2 mg via ORAL
  Filled 2018-08-31: qty 1

## 2018-08-31 MED ORDER — MELOXICAM 15 MG PO TABS: 15.0000 mg | ORAL_TABLET | Freq: Every day | ORAL | 0 refills | Status: AC

## 2018-08-31 NOTE — ED Provider Notes (Signed)
[facility] Emergency Department Provider Note ____________________________________________  Time seen: Approximately 4:55 PM  I have reviewed the triage vital signs and the nursing notes.   HISTORY  Chief Complaint Hand Pain    HPI Cynthia Kramer is a 48 y.o. female who presents to the emergency department for evaluation and treatment of right hand pain.  Patient states that she was trying to put her pants on earlier today fell forward and landed directly on her outstretched thumb.  Since that time she has had pain in her right thumb.  She states this occurred about 1230 today, but she had to wait until her daughter got off work to be driven to the emergency department.  She has taken some Tylenol and ibuprofen without much relief.  She has a history of left thumb dislocation.  Past Medical History:  Diagnosis Date  . GERD (gastroesophageal reflux disease)   . Hypertension     Patient Active Problem List   Diagnosis Date Noted  . Cellulitis of face 04/19/2017  . Sore throat 04/19/2017  . Cellulitis, face 04/19/2017  . Hypertension 03/17/2017  . Polyarthralgia 11/20/2016  . Rash 11/20/2016  . Raynaud's disease without gangrene 11/20/2016  . Weight loss 11/20/2016  . Allergic rhinitis, seasonal 02/19/2015  . GERD without esophagitis 02/19/2015  . Hx of hypokalemia 02/19/2015  . Pin tract infection (HCC) 08/24/2014  . Dislocation of carpometacarpal joint of left hand 07/31/2014    Past Surgical History:  Procedure Laterality Date  . ABDOMINAL HYSTERECTOMY    . CHOLECYSTECTOMY    . ESOPHAGOGASTRODUODENOSCOPY (EGD) WITH PROPOFOL N/A 03/23/2017   Procedure: ESOPHAGOGASTRODUODENOSCOPY (EGD) WITH PROPOFOL;  Surgeon: Wyline Mood, MD;  Location: [facility] ENDOSCOPY;  Service: Gastroenterology;  Laterality: N/A;  . HAND SURGERY    . TONSILLECTOMY      Prior to Admission medications   Medication Sig Start Date End Date Taking? Authorizing Provider  alum & mag  hydroxide-simeth (MAALOX MAX) 400-400-40 MG/5ML suspension Take 5 mLs by mouth every 6 (six) hours as needed for indigestion. 03/10/17   Nita Sickle, MD  Cetirizine HCl 10 MG CAPS Take 10 mg by mouth daily.     [provider]  fluticasone (FLONASE) 50 MCG/ACT nasal spray Place 2 sprays into both nostrils daily.    [provider]  hydrochlorothiazide (HYDRODIURIL) 25 MG tablet Take 25 mg by mouth daily.    [provider]  meloxicam (MOBIC) 15 MG tablet Take 1 tablet (15 mg total) by mouth daily. 08/31/18   Shyteria Lewis, Rulon Eisenmenger B, FNP  mupirocin ointment (BACTROBAN) 2 % Place 1 application into the nose 2 (two) times daily. 04/21/17   Enedina Finner, MD  omeprazole (PRILOSEC) 40 MG capsule Take 1 capsule (40 mg total) by mouth daily. 03/10/17 03/10/18  Nita Sickle, MD  ondansetron (ZOFRAN) 4 MG tablet Take 1 tablet (4 mg total) by mouth every 8 (eight) hours as needed for nausea or vomiting. 03/17/17   Wyline Mood, MD  oxyCODONE-acetaminophen (PERCOCET) 10-325 MG tablet Take 1 tablet by mouth every 6 (six) hours as needed for pain. 08/31/18 08/31/19  Chinita Pester, FNP    Allergies Phenergan [promethazine hcl]; Promethazine; and Ultram [tramadol]  No family history on file.  Social History Social History   Tobacco Use  . Smoking status: Former Smoker    Last attempt to quit: 12/16/2015    Years since quitting: 2.7  . Smokeless tobacco: Never Used  Substance Use Topics  . Alcohol use: No  . Drug  use: No    Review of Systems Constitutional: Negative for fever. Cardiovascular: Negative for chest pain. Respiratory: Negative for shortness of breath. Musculoskeletal: Positive for right thumb pain. Skin: Negative for open wound or lesion. Neurological: Negative for decrease in sensation  ____________________________________________   PHYSICAL EXAM:  VITAL SIGNS: ED Triage Vitals  Enc Vitals Group     BP 08/31/18 1603 (!) 157/99     Pulse Rate 08/31/18  1603 81     Resp 08/31/18 1603 18     Temp 08/31/18 1603 98.6 F (37 C)     Temp Source 08/31/18 1603 Oral     SpO2 08/31/18 1603 100 %     Weight 08/31/18 1604 150 lb (68 kg)     Height 08/31/18 1604  (1.651 m)     Head Circumference --      Peak Flow --      Pain Score 08/31/18 1604 8     Pain Loc --      Pain Edu? --      Excl. in GC? --     Constitutional: Alert and oriented. Well appearing and in no acute distress. Eyes: Conjunctivae are clear without discharge or drainage Head: Atraumatic Neck: Supple. Respiratory: No cough. Respirations are even and unlabored. Musculoskeletal: Obvious deformity at the MCP of the right thumb. Neurologic: Motor and sensory function of the right hand is intact. Skin: No open wounds or lesions noted to the right thumb. Psychiatric: Affect and behavior are appropriate.  ____________________________________________   LABS (all labs ordered are listed, but only abnormal results are displayed)  Labs Reviewed - No data to display ____________________________________________  RADIOLOGY  Initial image shows a dislocation of the right thumb at the MCP.  ____________________________________________   PROCEDURES  Procedures  ____________________________________________   INITIAL IMPRESSION / ASSESSMENT AND PLAN / ED COURSE  Cynthia Kramer is a 48 y.o. who presents to the emergency department for treatment and evaluation of the right hand pain after mechanical, non-syncopal fall this afternoon.  Initial image does show dislocation at the MCP of the right thumb.  Patient requested to be medicated prior to reduction.  Valium and Roxicet p.o. have been ordered.  No obvious dislocation of the right thumb per radiology, however there is a definite deformity at the [facility]  Thumb pulled into alignment and thumb spica OCL applied.  Repeat image of the thumb shows good placement and no bony abnormalities.  Patient continues to complain of  significant pain and prior to discharge she was given IM fentanyl.  She is to call and schedule an appointment with orthopedics.  She is to return to the emergency department for symptoms of change or worsen if she is unable to schedule an appointment with her primary care provider with orthopedist.  Medications  diazepam (VALIUM) tablet 2 mg (2 mg Oral Given 08/31/18 1658)  oxyCODONE (Oxy IR/ROXICODONE) immediate release tablet 5 mg (5 mg Oral Given 08/31/18 1658)  fentaNYL (SUBLIMAZE) injection 100 mcg (100 mcg Intramuscular Given 08/31/18 1850)    Pertinent labs & imaging results that were available during my care of the patient were reviewed by me and considered in my medical decision making (see chart for details).  _________________________________________   FINAL CLINICAL IMPRESSION(S) / ED DIAGNOSES  Final diagnoses:  Thumb injury, right, initial encounter  CMC (carpometacarpal joint) sprains, right, initial encounter    ED Discharge Orders         Ordered    meloxicam (MOBIC) 15 MG  tablet  Daily     08/31/18 1928    oxyCODONE-acetaminophen (PERCOCET) 10-325 MG tablet  Every 6 hours PRN     08/31/18 1928           If controlled substance prescribed during this visit, 12 month history viewed on the [facility] prior to issuing an initial prescription for Schedule II or III opiod.    Chinita Pester, FNP 08/31/18 2034    Jeanmarie Plant, MD 08/31/18 2040

## 2018-08-31 NOTE — Discharge Instructions (Signed)
Please call and schedule follow-up appointment with orthopedics.  Do not remove the splint until you are evaluated by the specialist.  Take medication as prescribed and do not take additional doses.  Return to the emergency department for symptoms of change or worsen if you are unable to schedule an appointment.

## 2018-08-31 NOTE — ED Triage Notes (Signed)
PT arrives after mechanical injury that resulted in an injury to right hand. Deformity of right thumb in triage.

## 2018-08-31 NOTE — ED Notes (Signed)
New splint applied by provider  Pt conts to have severe pain

## 2018-08-31 NOTE — ED Notes (Signed)
See triage note  Presents with injury to right hand  States she took a fall  Pain with possible deformity noted to thumb area

## 2018-12-04 ENCOUNTER — Emergency Department: Payer: Self-pay

## 2018-12-04 ENCOUNTER — Other Ambulatory Visit: Payer: Self-pay

## 2018-12-04 ENCOUNTER — Emergency Department
Admission: EM | Admit: 2018-12-04 | Discharge: 2018-12-04 | Disposition: A | Discharge status: Home / Self Care | Payer: Self-pay | Attending: Emergency Medicine | Admitting: Emergency Medicine

## 2018-12-04 ENCOUNTER — Encounter: Payer: Self-pay | Admitting: Emergency Medicine

## 2018-12-04 DIAGNOSIS — Z87891 Personal history of nicotine dependence: Secondary | ICD-10-CM | POA: Insufficient documentation

## 2018-12-04 DIAGNOSIS — F1123 Opioid dependence with withdrawal: Secondary | ICD-10-CM | POA: Insufficient documentation

## 2018-12-04 DIAGNOSIS — F1910 Other psychoactive substance abuse, uncomplicated: Secondary | ICD-10-CM | POA: Insufficient documentation

## 2018-12-04 DIAGNOSIS — Z79899 Other long term (current) drug therapy: Secondary | ICD-10-CM | POA: Insufficient documentation

## 2018-12-04 DIAGNOSIS — F1193 Opioid use, unspecified with withdrawal: Secondary | ICD-10-CM

## 2018-12-04 DIAGNOSIS — I10 Essential (primary) hypertension: Secondary | ICD-10-CM | POA: Insufficient documentation

## 2018-12-04 LAB — COMPREHENSIVE METABOLIC PANEL
ALT: 25 U/L (ref 0–44)
AST: 26 U/L (ref 15–41)
Albumin: 4.1 g/dL (ref 3.5–5.0)
Alkaline Phosphatase: 79 U/L (ref 38–126)
Anion gap: 11 (ref 5–15)
BUN: 11 mg/dL (ref 6–20)
CO2: 20 mmol/L — ABNORMAL LOW (ref 22–32)
Calcium: 9.3 mg/dL (ref 8.9–10.3)
Chloride: 106 mmol/L (ref 98–111)
Creatinine, Ser: 0.76 mg/dL (ref 0.44–1.00)
GFR calc Af Amer: >60 mL/min (ref >60)
GFR calc non Af Amer: >60 mL/min (ref >60)
Glucose, Bld: 133 mg/dL — ABNORMAL HIGH (ref 70–99)
Potassium: 3.2 mmol/L — ABNORMAL LOW (ref 3.5–5.1)
Sodium: 137 mmol/L (ref 135–145)
Total Bilirubin: 0.9 mg/dL (ref 0.3–1.2)
Total Protein: 7.7 g/dL (ref 6.5–8.1)

## 2018-12-04 LAB — CBC
HCT: 41 % (ref 36.0–46.0)
Hemoglobin: 14.2 g/dL (ref 12.0–15.0)
MCH: 29.7 pg (ref 26.0–34.0)
MCHC: 34.6 g/dL (ref 30.0–36.0)
MCV: 85.8 fL (ref 80.0–100.0)
Platelets: 317 10*3/uL (ref 150–400)
RBC: 4.78 MIL/uL (ref 3.87–5.11)
RDW: 11 % — ABNORMAL LOW (ref 11.5–15.5)
WBC: 9 10*3/uL (ref 4.0–10.5)
nRBC: 0 % (ref 0.0–0.2)

## 2018-12-04 LAB — TROPONIN I: Troponin I: <0.03 ng/mL (ref <0.03)

## 2018-12-04 LAB — LIPASE, BLOOD: Lipase: 112 U/L — ABNORMAL HIGH (ref 11–51)

## 2018-12-04 MED ORDER — LORAZEPAM 2 MG/ML IJ SOLN
1.0000 mg | Freq: Once | INTRAMUSCULAR | Status: AC
Start: 2018-12-04 — End: 2018-12-04
  Administered 2018-12-04: 1 mg via INTRAVENOUS
  Filled 2018-12-04: qty 1

## 2018-12-04 MED ORDER — SODIUM CHLORIDE 0.9% FLUSH: 3.0000 mL | Freq: Once | INTRAVENOUS | Status: DC

## 2018-12-04 MED ORDER — ONDANSETRON HCL 4 MG/2ML IJ SOLN
4.0000 mg | Freq: Once | INTRAMUSCULAR | Status: AC
  Administered 2018-12-04: 4 mg via INTRAVENOUS
  Filled 2018-12-04: qty 2

## 2018-12-04 MED ORDER — LOPERAMIDE HCL 2 MG PO TABS: 4.0000 mg | ORAL_TABLET | Freq: Four times a day (QID) | ORAL | 0 refills | Status: AC | PRN

## 2018-12-04 MED ORDER — ONDANSETRON 4 MG PO TBDP: 4.0000 mg | ORAL_TABLET | Freq: Three times a day (TID) | ORAL | 0 refills | Status: AC | PRN

## 2018-12-04 MED ORDER — SODIUM CHLORIDE 0.9 % IV BOLUS
500.0000 mL | Freq: Once | INTRAVENOUS | Status: AC
  Administered 2018-12-04: 13:00:00 500 mL via INTRAVENOUS

## 2018-12-04 NOTE — ED Provider Notes (Signed)
[facility] Emergency Department Provider Note   ____________________________________________   First MD Initiated Contact with Patient 12/04/18 1213     (approximate)  I have reviewed the triage vital signs and the nursing notes.   HISTORY  Chief Complaint Shortness of Breath; Drug Problem (requesting detox); and Chest Pain    HPI Cynthia Kramer is a 48 y.o. female here requesting treatment for "withdrawal"  Patient tells me that she is hurting all over.  Her body aches, or muscles ache, her chest hurts, she feels a bit nauseated, and that she is going through heroin withdrawal bad today.  She reports she is been using heroin daily for about 6 straight weeks, she stopped use yesterday after losing access to it and today supplemented by taking Suboxone tablet a friend have and also using methamphetamine.  She does report she feels like she just cannot stop moving, she feels anxious, tells me she is never used methamphetamine before.  Denies hallucinations.  Denies recent illness fevers but reports she feels like her muscles are achy, she feels twitchy, and she reports she feels like she is going through bad withdrawals from heroin which she is experienced in the past.  No desire to harm herself or anyone else.  No headache.  Nausea but denies abdominal pain.  She also tells me she is very anxious because she has such bad withdrawals   Past Medical History:  Diagnosis Date  . GERD (gastroesophageal reflux disease)   . Hypertension     Patient Active Problem List   Diagnosis Date Noted  . Cellulitis of face 04/19/2017  . Sore throat 04/19/2017  . Cellulitis, face 04/19/2017  . Hypertension 03/17/2017  . Polyarthralgia 11/20/2016  . Rash 11/20/2016  . Raynaud's disease without gangrene 11/20/2016  . Weight loss 11/20/2016  . Allergic rhinitis, seasonal 02/19/2015  . GERD without esophagitis 02/19/2015  . Hx of hypokalemia 02/19/2015  . Pin tract  infection (HCC) 08/24/2014  . Dislocation of carpometacarpal joint of left hand 07/31/2014    Past Surgical History:  Procedure Laterality Date  . ABDOMINAL HYSTERECTOMY    . CHOLECYSTECTOMY    . ESOPHAGOGASTRODUODENOSCOPY (EGD) WITH PROPOFOL N/A 03/23/2017   Procedure: ESOPHAGOGASTRODUODENOSCOPY (EGD) WITH PROPOFOL;  Surgeon: Wyline Mood, MD;  Location: [facility] ENDOSCOPY;  Service: Gastroenterology;  Laterality: N/A;  . HAND SURGERY    . TONSILLECTOMY      Prior to Admission medications   Medication Sig Start Date End Date Taking? Authorizing Provider  alum & mag hydroxide-simeth (MAALOX MAX) 400-400-40 MG/5ML suspension Take 5 mLs by mouth every 6 (six) hours as needed for indigestion. 03/10/17   Nita Sickle, MD  Cetirizine HCl 10 MG CAPS Take 10 mg by mouth daily.     [provider]  fluticasone (FLONASE) 50 MCG/ACT nasal spray Place 2 sprays into both nostrils daily.    [provider]  hydrochlorothiazide (HYDRODIURIL) 25 MG tablet Take 25 mg by mouth daily.    [provider]  loperamide (IMODIUM A-D) 2 MG tablet Take 2 tablets (4 mg total) by mouth 4 (four) times daily as needed for diarrhea or loose stools. 12/04/18   Sharman Cheek, MD  meloxicam (MOBIC) 15 MG tablet Take 1 tablet (15 mg total) by mouth daily. 08/31/18   Triplett, Rulon Eisenmenger B, FNP  mupirocin ointment (BACTROBAN) 2 % Place 1 application into the nose 2 (two) times daily. 04/21/17   Enedina Finner, MD  omeprazole (PRILOSEC) 40 MG capsule Take 1 capsule (40  mg total) by mouth daily. 03/10/17 03/10/18  Nita SickleVeronese, [place], MD  ondansetron (ZOFRAN ODT) 4 MG disintegrating tablet Take 1 tablet (4 mg total) by mouth every 8 (eight) hours as needed for nausea or vomiting. 12/04/18   Sharman CheekStafford, Phillip, MD  ondansetron (ZOFRAN) 4 MG tablet Take 1 tablet (4 mg total) by mouth every 8 (eight) hours as needed for nausea or vomiting. 03/17/17   Wyline MoodAnna, Kiran, MD  oxyCODONE-acetaminophen (PERCOCET) 10-325 MG tablet  Take 1 tablet by mouth every 6 (six) hours as needed for pain. 08/31/18 08/31/19  Chinita Pesterriplett, Cari B, FNP    Allergies Phenergan [promethazine hcl]; Promethazine; and Ultram [tramadol]  History reviewed. No pertinent family history.  Social History Social History   Tobacco Use  . Smoking status: Former Smoker    Last attempt to quit: 12/16/2015    Years since quitting: 2.9  . Smokeless tobacco: Never Used  Substance Use Topics  . Alcohol use: No  . Drug use: Yes    Types: Methamphetamines    Comment: heroin    Review of Systems Constitutional: No fever/chills but feels very achy and tired and weak and "withdrawing" Eyes: No visual changes. ENT: No sore throat. Cardiovascular: Denies chest pain except her muscles ache as they do across her entire body. Respiratory: Denies shortness of breath. Gastrointestinal: No abdominal pain.  Some nausea. Genitourinary: Negative for dysuria. Musculoskeletal: Negative for back pain except for aches in the muscles, also all of her arms and legs are achy. Skin: Negative for rash. Neurological: Negative for headaches, areas of focal weakness or numbness.    ____________________________________________   PHYSICAL EXAM:  VITAL SIGNS: ED Triage Vitals  Enc Vitals Group     BP 12/04/18 0957 104/80     Pulse Rate 12/04/18 0957 74     Resp 12/04/18 0957 (!) 28     Temp 12/04/18 0957 98.3 F (36.8 C)     Temp Source 12/04/18 0957 Oral     SpO2 12/04/18 0957 92 %     Weight 12/04/18 0959 155 lb (70.3 kg)     Height 12/04/18 0959 5\' 5"  (1.651 m)     Head Circumference --      Peak Flow --      Pain Score 12/04/18 0959 8     Pain Loc --      Pain Edu? --      Excl. in GC? --     Constitutional: Alert and oriented.  Appears somewhat anxious, pulling blanket overhead reports her body just feels very achy all over the place. Eyes: Conjunctivae are normal. Head: Atraumatic. Nose: No congestion/rhinnorhea. Mouth/Throat: Mucous membranes  are moist. Neck: No stridor.  Cardiovascular: Normal rate, regular rhythm. Grossly normal heart sounds.  Good peripheral circulation. Respiratory: Normal respiratory effort.  No retractions. Lungs CTAB.  She does breathe quickly when she discusses withdrawals, but as I speak to her respiratory rate improved and she seems to be with a normal respiratory pattern as she columns.  There is no wheezing.  She does not exhibit any coughing. Gastrointestinal: Soft and nontender. No distention. Musculoskeletal: No lower extremity tenderness nor edema. Neurologic:  Normal speech and language. No gross focal neurologic deficits are appreciated except she does have a little bit of slight chorea in her movements.  Skin:  Skin is warm, dry and intact. No rash noted. Psychiatric: Mood and affect are normal. Speech and behavior are normal.  ____________________________________________   LABS (all labs ordered are listed, but only abnormal results  are displayed)  Labs Reviewed  LIPASE, BLOOD - Abnormal; Notable for the following components:      Result Value   Lipase 112 (*)    All other components within normal limits  COMPREHENSIVE METABOLIC PANEL - Abnormal; Notable for the following components:   Potassium 3.2 (*)    CO2 20 (*)    Glucose, Bld 133 (*)    All other components within normal limits  CBC - Abnormal; Notable for the following components:   RDW 11.0 (*)    All other components within normal limits  TROPONIN I  URINALYSIS, COMPLETE (UACMP) WITH MICROSCOPIC  URINE DRUG SCREEN, QUALITATIVE ([facility] ONLY)   ____________________________________________  EKG  Reviewed entered by me at 10 AM Heart rate 75 QRS 85 QTc 480 Normal sinus rhythm, slightly prolonged QT interval.  No evidence of acute ischemia. ____________________________________________  RADIOLOGY  Dg Chest Port 1 View  Result Date: 12/04/2018 CLINICAL DATA:  Chest pain, shortness of breath EXAM: PORTABLE CHEST 1 VIEW  COMPARISON:  03/10/2017 FINDINGS: Lower lung volumes with increased vascular congestion and streaky bibasilar atelectasis. No definite focal pneumonia, collapse or consolidation. Negative for edema, effusion or pneumothorax. Exam is rotated to the left. No acute osseous finding. IMPRESSION: Low volume exam with vascular congestion and increased basilar atelectasis. Electronically Signed   By: Judie Petit.  Shick M.D.   On: 12/04/2018 15:00    X-ray reviewed no acute findings are noted.  Some vascular congestion. ____________________________________________   PROCEDURES  Procedure(s) performed: None  Procedures  Critical Care performed: No  ____________________________________________   INITIAL IMPRESSION / ASSESSMENT AND PLAN / ED COURSE  Pertinent labs & imaging results that were available during my care of the patient were reviewed by me and considered in my medical decision making (see chart for details).   Patient presents for concerns of "withdrawals".  She does exhibit symptoms of withdrawal including nausea, muscle aches, and reports abrupt stop of heroin yesterday and certainly would be in the window for heroin withdrawal which I suspect is present, but she also took a Suboxone tablet and use them amphetamine is presenting with some anxiety as well as choreoathetoid type movement  Cynthia Kramer was evaluated in Emergency Department on 12/04/2018 for the symptoms described in the history of present illness. She was evaluated in the context of the global COVID-19 pandemic, which necessitated consideration that the patient might be at risk for infection with the SARS-CoV-2 virus that causes COVID-19. Institutional protocols and algorithms that pertain to the evaluation of patients at risk for COVID-19 are in a state of rapid change based on information released by regulatory bodies including the CDC and federal and state organizations. These policies and algorithms were followed during the patient's  care in the ED.  No fever.  Denies COVID-like symptoms.  Symptomatology seems most consistent with withdrawal type syndrome based on the patient's chief complaint and clinical evaluation along with some coria likely secondary to amphetamine or stimulant use and can conjunction with her heroin withdrawal.  Clinical Course as of Dec 03 1537  Sat Dec 04, 2018  1538 Ongoing care signed Dr. Scotty Court.  I reassessed the patient about 30 minutes ago, she does appear to be somewhat improved her coria improving and her agitation anxiety seem to be also improving.  However, patient reports she does not yet feel ready to go home, still feels achy and though she is in withdrawal.  Signed out as further observation, symptomatic management Dr. Scotty Court.   [MQ]  Clinical Course User Index [MQ] Sharyn Creamer, MD     ____________________________________________   FINAL CLINICAL IMPRESSION(S) / ED DIAGNOSES  Final diagnoses:  Opioid withdrawal (HCC)  Polysubstance abuse (HCC)        Note:  This document was prepared using Dragon voice recognition software and may include unintentional dictation errors       Sharyn Creamer, MD 12/04/18 1540

## 2018-12-04 NOTE — ED Notes (Signed)
Pt only has a shirt on - she states she peed in them prior to being brought in by ems. Pt has multiple complaints but mostly abd pain. ekg and blood draw from triage. Pt has not provided a urine sample.

## 2018-12-04 NOTE — ED Notes (Signed)
Pt given cup for urine sample. Pt stated to dr that she took someone else's suboxone and meth today and is going thru heroin withdrawal. Pt states understanding that we need a urine sample. States she had a partial hysterectomy

## 2018-12-04 NOTE — ED Provider Notes (Addendum)
-----------------------------------------   3:31 PM on 12/04/2018 -----------------------------------------   RN reports that she could observe the patient through room window calm comfortable steady gait to and from toilet. However, when encountered, pt allows herself to fall on the bed and states she needs to stay in the ED for heroin withdrawal.  She's medically stable for DC and outpatient follow up .  She was offered assistance with detox/rehab referral by RTS but refused.  Unfortunately the ED is not equipped to provide detox services for her and she will need to seek care in an appropriate venue.  I will rx zofran and immodium for symptom control.  There appears to be a malingering component to her continued presence in the ED.  Despite requesting urine sample, pt used the toilet without providing one and is not cooperating with evaluation  Final diagnoses:  Opioid withdrawal (HCC)  polysubstance abuse    Sharman Cheek, MD 12/04/18 1534    Sharman Cheek, MD 12/04/18 1536

## 2018-12-04 NOTE — ED Notes (Addendum)
Pt observed ambulating to and from the toilet with steady gait thru the window of her room. When I went in she flopped into the bed moaning. I asked she was ready to be discharged and she stated no, she needed to sleep. When asked why she couldn't sleep at home she stated she was withdrawing from heroin and needed to stay in the hospital. Pt still has not given urine sample even when reminded and observed walking to the toilet

## 2018-12-04 NOTE — ED Notes (Signed)
Pt's friend that dropped her off and has her car called again. Pt reminded to call her friend when discharged.

## 2018-12-04 NOTE — ED Notes (Signed)
Spoke with pt's ride - she will be here in 30 mins

## 2018-12-04 NOTE — ED Triage Notes (Signed)
Pt present with active vomiting, yelling out, thrashing in wheelchair, admits to taking meth, ciboxin, xanax, this morning. Frequently yelling out during assessment. Pt also c/o chest pain and SOB.

## 2018-12-04 NOTE — ED Notes (Signed)
Pt told tts she had to go to the restroom, but on my arrival states she doesn't have to go. tts also got her some paper scrubs to wear when discharged.
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
## 2019-01-28 ENCOUNTER — Ambulatory Visit
Admission: EM | Admit: 2019-01-28 | Discharge: 2019-01-28 | Disposition: A | Discharge status: Home / Self Care | Payer: Commercial Managed Care - PPO | Attending: Family Medicine | Admitting: Family Medicine

## 2019-01-28 ENCOUNTER — Other Ambulatory Visit: Payer: Self-pay

## 2019-01-28 ENCOUNTER — Encounter: Payer: Self-pay | Admitting: Emergency Medicine

## 2019-01-28 DIAGNOSIS — R51 Headache: Secondary | ICD-10-CM

## 2019-01-28 DIAGNOSIS — B349 Viral infection, unspecified: Secondary | ICD-10-CM | POA: Diagnosis not present

## 2019-01-28 DIAGNOSIS — Z87891 Personal history of nicotine dependence: Secondary | ICD-10-CM

## 2019-01-28 DIAGNOSIS — J029 Acute pharyngitis, unspecified: Secondary | ICD-10-CM | POA: Diagnosis not present

## 2019-01-28 LAB — RAPID STREP SCREEN (MED CTR MEBANE ONLY): Streptococcus, Group A Screen (Direct): NEGATIVE

## 2019-01-28 NOTE — Discharge Instructions (Signed)
Rest, fluids, over the counter medications Self quarantine at least until test result back

## 2019-01-28 NOTE — ED Provider Notes (Signed)
[facility]    CSN: [id number] Arrival date & time: 01/28/19  1616     History   Chief Complaint Chief Complaint  Patient presents with  . Sinus Problem  . Headache  . Sore Throat    HPI Cynthia Kramer is a 48 y.o. female.   48 yo female with a c/o sore throat, nasal congestion, chills, bodyaches for the past 2-3 days. Denies any cough, fevers, chest pains, or shortness of breath.    Sinus Problem Associated symptoms include headaches.  Headache Sore Throat Associated symptoms include headaches.    Past Medical History:  Diagnosis Date  . GERD (gastroesophageal reflux disease)   . Hypertension     Patient Active Problem List   Diagnosis Date Noted  . Cellulitis of face 04/19/2017  . Sore throat 04/19/2017  . Cellulitis, face 04/19/2017  . Hypertension 03/17/2017  . Polyarthralgia 11/20/2016  . Rash 11/20/2016  . Raynaud's disease without gangrene 11/20/2016  . Weight loss 11/20/2016  . Allergic rhinitis, seasonal 02/19/2015  . GERD without esophagitis 02/19/2015  . Hx of hypokalemia 02/19/2015  . Pin tract infection (HCC) 08/24/2014  . Dislocation of carpometacarpal joint of left hand 07/31/2014    Past Surgical History:  Procedure Laterality Date  . ABDOMINAL HYSTERECTOMY    . CHOLECYSTECTOMY    . ESOPHAGOGASTRODUODENOSCOPY (EGD) WITH PROPOFOL N/A 03/23/2017   Procedure: ESOPHAGOGASTRODUODENOSCOPY (EGD) WITH PROPOFOL;  Surgeon: Wyline MoodAnna, Kiran, MD;  [facility] END[facility];  Service: Gastroenterology;  Laterality: N/A;  . HAND SURGERY    . TONSILLECTOMY      OB History    Gravida  2   Para      Term      Preterm      AB      Living  3     SAB      TAB      Ectopic      Multiple      Live Births               Home Medications    Prior to Admission medications   Medication Sig Start Date End Date Taking? Authorizing Provider  fluticasone (FLONASE) 50 MCG/ACT nasal spray Place 2 sprays into both nostrils daily.    Yes [provider]  alum & mag hydroxide-simeth (MAALOX MAX) 400-400-40 MG/5ML suspension Take 5 mLs by mouth every 6 (six) hours as needed for indigestion. 03/10/17   Nita SickleVeronese, [place], MD  Cetirizine HCl 10 MG CAPS Take 10 mg by mouth daily.     [provider]  hydrochlorothiazide (HYDRODIURIL) 25 MG tablet Take 25 mg by mouth daily.    [provider]  loperamide (IMODIUM A-D) 2 MG tablet Take 2 tablets (4 mg total) by mouth 4 (four) times daily as needed for diarrhea or loose stools. 12/04/18   Sharman CheekStafford, Phillip, MD  meloxicam (MOBIC) 15 MG tablet Take 1 tablet (15 mg total) by mouth daily. 08/31/18   Triplett, Rulon Eisenmengerari B, FNP  mupirocin ointment (BACTROBAN) 2 % Place 1 application into the nose 2 (two) times daily. 04/21/17   Enedina FinnerPatel, Sona, MD  omeprazole (PRILOSEC) 40 MG capsule Take 1 capsule (40 mg total) by mouth daily. 03/10/17 03/10/18  Nita SickleVeronese, Beaver, MD  ondansetron (ZOFRAN ODT) 4 MG disintegrating tablet Take 1 tablet (4 mg total) by mouth every 8 (eight) hours as needed for nausea or vomiting. 12/04/18   Sharman CheekStafford, Phillip, MD  ondansetron (ZOFRAN) 4 MG tablet Take 1 tablet (4 mg total)  by mouth every 8 (eight) hours as needed for nausea or vomiting. 03/17/17   Wyline MoodAnna, Kiran, MD  oxyCODONE-acetaminophen (PERCOCET) 10-325 MG tablet Take 1 tablet by mouth every 6 (six) hours as needed for pain. 08/31/18 08/31/19  Chinita Pesterriplett, Cari B, FNP    Family History History reviewed. No pertinent family history.  Social History Social History   Tobacco Use  . Smoking status: Former Smoker    Quit date: 12/16/2015    Years since quitting: 3.1  . Smokeless tobacco: Never Used  Substance Use Topics  . Alcohol use: No  . Drug use: Yes    Types: Methamphetamines    Comment: heroin     Allergies   Phenergan [promethazine hcl], Promethazine, and Ultram [tramadol]   Review of Systems Review of Systems  Neurological: Positive for headaches.     Physical Exam Triage Vital  Signs ED Triage Vitals  Enc Vitals Group     BP 01/28/19 1633 (!) 135/91     Pulse Rate 01/28/19 1633 78     Resp 01/28/19 1633 14     Temp 01/28/19 1633 98.7 F (37.1 C)     Temp Source 01/28/19 1633 Oral     SpO2 01/28/19 1633 100 %     Weight 01/28/19 1628 150 lb (68 kg)     Height 01/28/19 1628 5\' 5"  (1.651 m)     Head Circumference --      Peak Flow --      Pain Score 01/28/19 1628 5     Pain Loc --      Pain Edu? --      Excl. in GC? --    No data found.  Updated Vital Signs BP (!) 135/91 (BP Location: Right Arm)   Pulse 78   Temp 98.7 F (37.1 C) (Oral)   Resp 14   Ht 5\' 5"  (1.651 m)   Wt 68 kg   SpO2 100%   BMI 24.96 kg/m   Visual Acuity Right Eye Distance:   Left Eye Distance:   Bilateral Distance:    Right Eye Near:   Left Eye Near:    Bilateral Near:     Physical Exam Vitals signs reviewed.  Constitutional:      General: She is not in acute distress.    Appearance: She is not toxic-appearing or diaphoretic.  HENT:     Right Ear: Tympanic membrane normal.     Left Ear: Tympanic membrane normal.     Mouth/Throat:     Pharynx: Posterior oropharyngeal erythema present. No oropharyngeal exudate.  Cardiovascular:     Rate and Rhythm: Normal rate and regular rhythm.  Pulmonary:     Effort: Pulmonary effort is normal. No respiratory distress.     Breath sounds: Normal breath sounds.  Neurological:     Mental Status: She is alert.      UC Treatments / Results  Labs (all labs ordered are listed, but only abnormal results are displayed) Labs Reviewed  RAPID STREP SCREEN ([facility] ONLY)  NOVEL CORONAVIRUS, NAA (HOSPITAL ORDER, SEND-OUT TO REF LAB)  CULTURE, GROUP A STREP [facility]    EKG   Radiology No results found.  Procedures Procedures (including critical care time)  Medications Ordered in UC Medications - No data to display  Initial Impression / Assessment and Plan / UC Course  I have reviewed the triage vital signs and the  nursing notes.  Pertinent labs & imaging results that were available during my care of the  patient were reviewed by me and considered in my medical decision making (see chart for details).      Final Clinical Impressions(s) / UC Diagnoses   Final diagnoses:  Viral illness     Discharge Instructions     Rest, fluids, over the counter medications Self quarantine at least until test result back    ED Prescriptions    None      1. Lab result (negative strep) and diagnosis reviewed with patient 2. Recommend supportive treatment as above 3. Await covid test result 4. Follow up prn   Controlled Substance Prescriptions [place] Controlled Substance Registry consulted? Not Applicable   Norval Gable, MD 01/28/19 [phone]

## 2019-01-28 NOTE — ED Triage Notes (Signed)
Patient c/o sore throat, sinus congestion, nasal congestion, and HAs for the past couple of days.  Patient denies fevers.  Patient reports chills.

## 2019-01-29 LAB — NOVEL CORONAVIRUS, NAA (HOSP ORDER, SEND-OUT TO REF LAB; TAT 18-24 HRS): SARS-CoV-2, NAA: NOT DETECTED

## 2019-01-30 ENCOUNTER — Encounter (HOSPITAL_COMMUNITY): Payer: Self-pay

## 2019-01-31 LAB — CULTURE, GROUP A STREP (THRC)

## 2019-03-16 ENCOUNTER — Other Ambulatory Visit: Payer: Self-pay

## 2019-03-16 ENCOUNTER — Ambulatory Visit
Admission: EM | Admit: 2019-03-16 | Discharge: 2019-03-16 | Disposition: A | Discharge status: Home / Self Care | Payer: Commercial Managed Care - PPO | Attending: Family Medicine | Admitting: Family Medicine

## 2019-03-16 DIAGNOSIS — Z7689 Persons encountering health services in other specified circumstances: Secondary | ICD-10-CM | POA: Diagnosis not present

## 2019-03-16 NOTE — ED Triage Notes (Signed)
Pt needs a work note stating she is fine to return to work. She had dislocated her right thumb in the past and did injury it recently and iced it and it feels better but now her work requests a note stating she is cleared to return without restrictions. No complaints.

## 2019-03-16 NOTE — Discharge Instructions (Signed)
Okay to return to work.  Take care  Dr. Dennice Tindol  

## 2019-03-16 NOTE — ED Provider Notes (Signed)
[facility]    CSN: [id number] Arrival date & time: 03/16/19  1909  History   Chief Complaint Chief Complaint  Patient presents with  . Letter for School/Work   HPI  48 year old female presents for a return to work evaluation.  Patient states that her right thumb dislocates often.  She states that she has had a similar issue of her left thumb that she has had surgery regarding this.  Patient reports that she recently dislocated her thumb on Thursday and it has been bothering her since.  Patient states that she is currently feeling well and has minimal pain.  She states that it is back in place.  She states that this happens often.  She was informed by her work that she needs a note saying she can return without restriction.  She is here today for that reason.  No recent fall or other injury.  No other associated symptoms.  No other complaints.  PMH, Surgical Hx, Family Hx, Social History reviewed and updated as below.  Past Medical History:  Diagnosis Date  . GERD (gastroesophageal reflux disease)   . Hypertension   Polyarthralgia Raynauds disease Allergic rhinitis Dislocation of [facility] joint HTN  Patient Active Problem List   Diagnosis Date Noted  . Cellulitis of face 04/19/2017  . Sore throat 04/19/2017  . Cellulitis, face 04/19/2017  . Hypertension 03/17/2017  . Polyarthralgia 11/20/2016  . Rash 11/20/2016  . Raynaud's disease without gangrene 11/20/2016  . Weight loss 11/20/2016  . Allergic rhinitis, seasonal 02/19/2015  . GERD without esophagitis 02/19/2015  . Hx of hypokalemia 02/19/2015  . Pin tract infection ([place]) 08/24/2014  . Dislocation of carpometacarpal joint of left hand 07/31/2014   Surgical Hx: TONSILLECTOMY      CHOLECYSTECTOMY      closed reduction and percutaneous pinning of left thumb carpometacarpal joint 07/26/14 Left    TRANSPLANT/TRANSFER TENDON FOREARM &/WRIST 02/20/2015 Arm Lower/Left Arm Lower/Left Hand/Left Arm Lower/Left  Procedure: TENDON TRANSPLANTATION OR TRANSFER, FLEXOR OR EXTENSOR, FOREARM AND/OR WRIST, SINGLE; EACH TENDON; Surgeon: Rennis Golden, MD; Location: [place]; Service: Orthopedics; Laterality: Left;  Medical devices from this surgery are in the Implants section.   ARTHROPLASTY CARPOMETACARPAL/INTERCARPAL JOINTS 02/20/2015 Arm Lower/Left Arm Lower/Left Hand/Left Arm Lower/Left Procedure: ARTHROPLASTY, INTERPOSITION, INTERCARPAL OR CARPOMETACARPAL JOINTS; Surgeon: Rennis Golden, MD; Location: [place]; Service: Orthopedics; Laterality: Left;  Medical devices from this surgery are in the Implants section.   NEUROPLASTY NERVE HAND/FOOT 02/20/2015 Hand/Left Procedure: NEUROPLASTY NERVE HAND SUPERFICIAL RADIAL ; Surgeon: Rennis Golden, MD; Location: [place]; Service: Orthopedics; Laterality: Left;  Medical devices from this surgery are in the Implants section.   INTRAOPERATIVE FLUOROSCOPY 02/20/2015 Arm Lower/Left Procedure: INTRAOPERATIVE FLOUROSCOPY; Surgeon: Rennis Golden, MD; Location: [place]; Service: Orthopedics; Laterality: Left; 10 seconds  Medical devices from this surgery are in the Implants section.   HYSTERECTOMY 07/08/2003 - 07/06/2004  partial, cervix and 1 ovary remain   LSH-RSO 07/08/2003 - 07/06/2004       OB History    Gravida  2   Para      Term      Preterm      AB      Living  3     SAB      TAB      Ectopic      Multiple      Live Births               Home Medications    Prior to  Admission medications   Medication Sig Start Date End Date Taking? Authorizing Provider  fluticasone (FLONASE) 50 MCG/ACT nasal spray Place 2 sprays into both nostrils daily.   Yes [provider]  alum & mag hydroxide-simeth (MAALOX MAX) 400-400-40 MG/5ML suspension Take 5 mLs by mouth every 6 (six) hours as needed for indigestion. 03/10/17   Nita Sickle, MD  Cetirizine HCl 10 MG CAPS Take 10 mg by mouth daily.     [provider]  hydrochlorothiazide (HYDRODIURIL) 25 MG tablet Take 25 mg by mouth daily.    [provider]  loperamide (IMODIUM A-D) 2 MG tablet Take 2 tablets (4 mg total) by mouth 4 (four) times daily as needed for diarrhea or loose stools. 12/04/18   Sharman Cheek, MD  meloxicam (MOBIC) 15 MG tablet Take 1 tablet (15 mg total) by mouth daily. 08/31/18   Triplett, Rulon Eisenmenger B, FNP  mupirocin ointment (BACTROBAN) 2 % Place 1 application into the nose 2 (two) times daily. 04/21/17   Enedina Finner, MD  omeprazole (PRILOSEC) 40 MG capsule Take 1 capsule (40 mg total) by mouth daily. 03/10/17 03/10/18  Nita Sickle, MD  ondansetron (ZOFRAN ODT) 4 MG disintegrating tablet Take 1 tablet (4 mg total) by mouth every 8 (eight) hours as needed for nausea or vomiting. 12/04/18   Sharman Cheek, MD  ondansetron (ZOFRAN) 4 MG tablet Take 1 tablet (4 mg total) by mouth every 8 (eight) hours as needed for nausea or vomiting. 03/17/17   Wyline Mood, MD  oxyCODONE-acetaminophen (PERCOCET) 10-325 MG tablet Take 1 tablet by mouth every 6 (six) hours as needed for pain. 08/31/18 08/31/19  Chinita Pester, FNP    Family History No Known Problems Father    Ovarian cancer Maternal Aunt    Alzheimer's disease Mother      Social History Social History   Tobacco Use  . Smoking status: Former Smoker    Quit date: 12/16/2015    Years since quitting: 3.2  . Smokeless tobacco: Never Used  Substance Use Topics  . Alcohol use: No  . Drug use: Yes    Types: Methamphetamines    Comment: heroin     Allergies   Phenergan [promethazine hcl], Promethazine, and Ultram [tramadol]   Review of Systems Review of Systems  Constitutional: Negative.   Musculoskeletal:       Left thumb pain.   Physical Exam Triage Vital Signs ED Triage Vitals  Enc Vitals Group     BP 03/16/19 1918 (!) 134/91     Pulse Rate 03/16/19 1918 (!) 101     Resp 03/16/19 1918 18     Temp 03/16/19 1918 99.2 F (37.3 C)     Temp Source  03/16/19 1918 Oral     SpO2 03/16/19 1918 100 %     Weight 03/16/19 1920 145 lb (65.8 kg)     Height --      Head Circumference --      Peak Flow --      Pain Score 03/16/19 1920 0     Pain Loc --      Pain Edu? --      Excl. in GC? --    No data found.  Updated Vital Signs BP (!) 134/91 (BP Location: Right Arm)   Pulse (!) 101   Temp 99.2 F (37.3 C) (Oral)   Resp 18   Wt 65.8 kg   SpO2 100%   BMI 24.13 kg/m   Visual Acuity Right Eye Distance:  Left Eye Distance:   Bilateral Distance:    Right Eye Near:   Left Eye Near:    Bilateral Near:     Physical Exam Vitals signs and nursing note reviewed.  Constitutional:      General: She is not in acute distress.    Appearance: Normal appearance. She is not ill-appearing.  HENT:     Head: Normocephalic and atraumatic.  Eyes:     General:        Right eye: No discharge.        Left eye: No discharge.     Conjunctiva/sclera: Conjunctivae normal.  Pulmonary:     Effort: Pulmonary effort is normal. No respiratory distress.  Musculoskeletal:     Comments: Left thumb -no apparent swelling.  Normal range of motion.  Skin:    General: Skin is warm.     Findings: No rash.  Neurological:     Mental Status: She is alert.  Psychiatric:        Mood and Affect: Mood normal.        Behavior: Behavior normal.    UC Treatments / Results  Labs (all labs ordered are listed, but only abnormal results are displayed) Labs Reviewed - No data to display  EKG   Radiology No results found.  Procedures Procedures (including critical care time)  Medications Ordered in UC Medications - No data to display  Initial Impression / Assessment and Plan / UC Course  I have reviewed the triage vital signs and the nursing notes.  Pertinent labs & imaging results that were available during my care of the patient were reviewed by me and considered in my medical decision making (see chart for details).    48 year old female presents  for a return to work evaluation.  Exam normal currently.  Patient cleared to return to work without restriction.  Final Clinical Impressions(s) / UC Diagnoses   Final diagnoses:  Return to work evaluation     Discharge Instructions     Okay to return to work.  Take care  Dr. Adriana Simasook    ED Prescriptions    None     Controlled Substance Prescriptions [place] Controlled Substance Registry consulted? Not Applicable   Tommie SamsCook, Mea Ozga G, DO 03/16/19 1934

## 2019-03-28 ENCOUNTER — Ambulatory Visit
Admission: EM | Admit: 2019-03-28 | Discharge: 2019-03-28 | Disposition: A | Discharge status: Home / Self Care | Payer: Commercial Managed Care - PPO | Attending: Family Medicine | Admitting: Family Medicine

## 2019-03-28 ENCOUNTER — Ambulatory Visit (INDEPENDENT_AMBULATORY_CARE_PROVIDER_SITE_OTHER): Payer: Commercial Managed Care - PPO

## 2019-03-28 ENCOUNTER — Other Ambulatory Visit: Payer: Self-pay

## 2019-03-28 DIAGNOSIS — M25561 Pain in right knee: Secondary | ICD-10-CM | POA: Diagnosis not present

## 2019-03-28 DIAGNOSIS — S82001A Unspecified fracture of right patella, initial encounter for closed fracture: Secondary | ICD-10-CM

## 2019-03-28 DIAGNOSIS — W19XXXA Unspecified fall, initial encounter: Secondary | ICD-10-CM

## 2019-03-28 MED ORDER — HYDROCODONE-ACETAMINOPHEN 5-325 MG PO TABS: ORAL_TABLET | ORAL | 0 refills | Status: AC

## 2019-03-28 MED ORDER — HYDROCODONE-ACETAMINOPHEN 5-325 MG PO TABS: ORAL_TABLET | ORAL | 0 refills | Status: DC

## 2019-03-28 NOTE — Discharge Instructions (Signed)
Follow up with orthopedist in the next 2 days

## 2019-03-28 NOTE — ED Triage Notes (Signed)
Pt fell 5 days ago and hit knees on the pavement. Still having pain mostly in right knee and limping gait. Work wants her evaluated. Pain 6/10

## 2019-03-28 NOTE — ED Provider Notes (Signed)
[facility]    CSN: [id number] Arrival date & time: 03/28/19  1928      History   Chief Complaint Chief Complaint  Patient presents with  . Knee Pain    HPI Cynthia Kramer is a 48 y.o. female.   48 yo female with a c/o right knee pain after falling and hitting her knee on the pavement 5 days ago. Patient states she's been bearing weight, limping on it, however it's very painful. Has been applying ice and taking tylenol/ibuprofen with mild relief.    Knee Pain   Past Medical History:  Diagnosis Date  . GERD (gastroesophageal reflux disease)   . Hypertension     Patient Active Problem List   Diagnosis Date Noted  . Cellulitis of face 04/19/2017  . Sore throat 04/19/2017  . Cellulitis, face 04/19/2017  . Hypertension 03/17/2017  . Polyarthralgia 11/20/2016  . Rash 11/20/2016  . Raynaud's disease without gangrene 11/20/2016  . Weight loss 11/20/2016  . Allergic rhinitis, seasonal 02/19/2015  . GERD without esophagitis 02/19/2015  . Hx of hypokalemia 02/19/2015  . Pin tract infection ([place]) 08/24/2014  . Dislocation of carpometacarpal joint of left hand 07/31/2014    Past Surgical History:  Procedure Laterality Date  . ABDOMINAL HYSTERECTOMY    . CHOLECYSTECTOMY    . ESOPHAGOGASTRODUODENOSCOPY (EGD) WITH PROPOFOL N/A 03/23/2017   Procedure: ESOPHAGOGASTRODUODENOSCOPY (EGD) WITH PROPOFOL;  Surgeon: Jonathon Bellows, MD;  Location: [facility] ENDOSCOPY;  Service: Gastroenterology;  Laterality: N/A;  . HAND SURGERY    . TONSILLECTOMY      OB History    Gravida  2   Para      Term      Preterm      AB      Living  3     SAB      TAB      Ectopic      Multiple      Live Births               Home Medications    Prior to Admission medications   Medication Sig Start Date End Date Taking? Authorizing Provider  alum & mag hydroxide-simeth (MAALOX MAX) 400-400-40 MG/5ML suspension Take 5 mLs by mouth every 6 (six) hours as needed for  indigestion. 03/10/17   Rudene Re, MD  Cetirizine HCl 10 MG CAPS Take 10 mg by mouth daily.     [provider]  fluticasone (FLONASE) 50 MCG/ACT nasal spray Place 2 sprays into both nostrils daily.    [provider]  hydrochlorothiazide (HYDRODIURIL) 25 MG tablet Take 25 mg by mouth daily.    [provider]  HYDROcodone-acetaminophen (NORCO/VICODIN) 5-325 MG tablet 1-2 tabs po bid prn 03/28/19   Norval Gable, MD  loperamide (IMODIUM A-D) 2 MG tablet Take 2 tablets (4 mg total) by mouth 4 (four) times daily as needed for diarrhea or loose stools. 12/04/18   Carrie Mew, MD  meloxicam (MOBIC) 15 MG tablet Take 1 tablet (15 mg total) by mouth daily. 08/31/18   Triplett, Johnette Abraham B, FNP  mupirocin ointment (BACTROBAN) 2 % Place 1 application into the nose 2 (two) times daily. 04/21/17   Fritzi Mandes, MD  omeprazole (PRILOSEC) 40 MG capsule Take 1 capsule (40 mg total) by mouth daily. 03/10/17 03/10/18  Rudene Re, MD  ondansetron (ZOFRAN ODT) 4 MG disintegrating tablet Take 1 tablet (4 mg total) by mouth every 8 (eight) hours as needed for nausea or vomiting. 12/04/18  Sharman CheekStafford, Phillip, MD  ondansetron [facility](ZOFRAN) 4 MG tablet Take 1 tablet (4 mg total) by mouth every 8 (eight) hours as needed for nausea or vomiting. 03/17/17   Wyline MoodAnna, Kiran, MD  oxyCODONE-acetaminophen (PERCOCET) 10-325 MG tablet Take 1 tablet by mouth every 6 (six) hours as needed for pain. 08/31/18 08/31/19  Chinita Pesterriplett, Cari B, FNP    Family History History reviewed. No pertinent family history.  Social History Social History   Tobacco Use  . Smoking status: Current Every Day Smoker    Packs/day: 0.50    Types: Cigarettes  . Smokeless tobacco: Never Used  Substance Use Topics  . Alcohol use: Yes    Comment: social  . Drug use: Not Currently    Types: Methamphetamines    Comment: heroin     Allergies   Phenergan [promethazine hcl], Promethazine, and Ultram [tramadol]   Review of  Systems Review of Systems   Physical Exam Triage Vital Signs ED Triage Vitals  Enc Vitals Group     BP 03/28/19 1942 130/88     Pulse Rate 03/28/19 1942 98     Resp 03/28/19 1942 17     Temp 03/28/19 1942 98.6 F (37 C)     Temp Source 03/28/19 1942 Oral     SpO2 03/28/19 1942 100 %     Weight 03/28/19 1943 149 lb (67.6 kg)     Height 03/28/19 1943 5\' 5"  (1.651 m)     Head Circumference --      Peak Flow --      Pain Score 03/28/19 1943 6     Pain Loc --      Pain Edu? --      Excl. in GC? --    No data found.  Updated Vital Signs BP 130/88 (BP Location: Right Arm)   Pulse 98   Temp 98.6 F (37 C) (Oral)   Resp 17   Ht 5\' 5"  (1.651 m)   Wt 67.6 kg   SpO2 100%   BMI 24.79 kg/m   Visual Acuity Right Eye Distance:   Left Eye Distance:   Bilateral Distance:    Right Eye Near:   Left Eye Near:    Bilateral Near:     Physical Exam Vitals signs and nursing note reviewed.  Constitutional:      General: She is not in acute distress.    Appearance: She is not toxic-appearing or diaphoretic.  Musculoskeletal:     Right knee: She exhibits decreased range of motion, swelling and bony tenderness. She exhibits no effusion, no ecchymosis, no deformity, no laceration, no erythema, normal alignment, no LCL laxity and no MCL laxity. Tenderness found. Patellar tendon tenderness noted.     Comments: Right lower extremity neurovascularly intact  Neurological:     Mental Status: She is alert.      UC Treatments / Results  Labs (all labs ordered are listed, but only abnormal results are displayed) Labs Reviewed - No data to display  EKG   Radiology Dg Knee Complete 4 Views Right  Result Date: 03/28/2019 CLINICAL DATA:  fall 5 days ago.  Knee pain. EXAM: RIGHT KNEE - COMPLETE 4+ VIEW COMPARISON:  None FINDINGS: Linear lucency seen within the patella on the sunrise view concerning for nondisplaced patellar fracture. No joint effusion. No additional fracture. No  subluxation or dislocation. IMPRESSION: Findings concerning for nondisplaced patellar fracture. Electronically Signed   By: Charlett NoseKevin  Dover M.D.   On: 03/28/2019 20:01    Procedures Procedures (including  critical care time)  Medications Ordered in UC Medications - No data to display  Initial Impression / Assessment and Plan / UC Course  I have reviewed the triage vital signs and the nursing notes.  Pertinent labs & imaging results that were available during my care of the patient were reviewed by me and considered in my medical decision making (see chart for details).      Final Clinical Impressions(s) / UC Diagnoses   Final diagnoses:  Closed nondisplaced fracture of right patella, unspecified fracture morphology, initial encounter     Discharge Instructions     Follow up with orthopedist in the next 2 days    ED Prescriptions    Medication Sig Dispense Auth. Provider   HYDROcodone-acetaminophen (NORCO/VICODIN) 5-325 MG tablet  (Status: Discontinued) 1-2 tabs po bid prn 8 tablet Payton Mccallum, MD   HYDROcodone-acetaminophen (NORCO/VICODIN) 5-325 MG tablet 1-2 tabs po bid prn 8 tablet Brynnleigh Mcelwee, Pamala Hurry, MD     1. x-ray results and diagnosis reviewed with patient 2. rx as per orders above; reviewed possible side effects, interactions, risks and benefits  3. Recommend supportive treatment with rest, elevation, over the counter tylenol/advil prn 4. Follow up with orthopedist this week 5. Follow-up prn  I have reviewed the PDMP during this encounter.   Payton Mccallum, MD 03/28/19 2028
